# Patient Record
Sex: Female | Born: 1960 | Race: White | Hispanic: No | Smoking: Current every day smoker
Health system: Southern US, Community
[De-identification: ages and names within clinical notes are randomized; demographics above are authoritative.]

## PROBLEM LIST (undated history)

## (undated) DIAGNOSIS — Z87442 Personal history of urinary calculi: Secondary | ICD-10-CM

## (undated) DIAGNOSIS — R112 Nausea with vomiting, unspecified: Secondary | ICD-10-CM

## (undated) DIAGNOSIS — Z9889 Other specified postprocedural states: Secondary | ICD-10-CM

## (undated) DIAGNOSIS — F32A Depression, unspecified: Secondary | ICD-10-CM

## (undated) DIAGNOSIS — I10 Essential (primary) hypertension: Secondary | ICD-10-CM

## (undated) HISTORY — PX: BUNIONECTOMY: SHX129

## (undated) HISTORY — PX: EYE SURGERY: SHX253

## (undated) HISTORY — PX: TUBAL LIGATION: SHX77

## (undated) HISTORY — PX: LASER ABLATION OF THE CERVIX: SHX1949

## (undated) HISTORY — PX: CYST EXCISION: SHX5701

---

## 1986-01-13 DIAGNOSIS — N879 Dysplasia of cervix uteri, unspecified: Secondary | ICD-10-CM | POA: Insufficient documentation

## 2000-01-20 DIAGNOSIS — I10 Essential (primary) hypertension: Secondary | ICD-10-CM | POA: Insufficient documentation

## 2000-02-20 DIAGNOSIS — F32A Depression, unspecified: Secondary | ICD-10-CM | POA: Insufficient documentation

## 2009-02-19 DIAGNOSIS — E0500 Thyrotoxicosis with diffuse goiter without thyrotoxic crisis or storm: Secondary | ICD-10-CM | POA: Insufficient documentation

## 2012-08-10 IMAGING — NM NM RAI THERAPY FOR HYPERTHYROIDISM
1 series · 1 of 1 positions shown · non-contrast
Comparison: none

CLINICAL DATA: Graves disease

NUCLEAR MEDICINE RADIOACTIVE IODINE THERAPY FOR HYPERTHYROIDISM:
TECHNIQUE: I personally discussed the risks and benefits of D-3H3 therapy for
hyperthyroidism with the patient.
Alternate treatments were also discussed, and I answered the
patient's questions.
Radiation safety principals and post-therapy guidelines for
protecting the population were discussed with the patient.
Written informed consent for radioactive iodine therapy for
hyperthyroidism was obtained .
Time-out procedure performed.
Following ingestion of the radiopharmaceutical, patient was given a
page of standard post-therapy instructions.
Patient instructed to call the nuclear medicine department if any
questions arise.
No immediate complications.
Radiopharmaceutical:  25 mCi, D-3H3 sodium iodide capsule orally
Correlative exams:  Radionuclide nuclear medicine uptake and scan,
which demonstrated a 34.2% 24 radioiodine uptake and homogeneous
tracer distribution in both lobes compatible with Graves' disease.

[st static · 2.33mm/px · 1 of 1 slices shown]
[im 1/1]
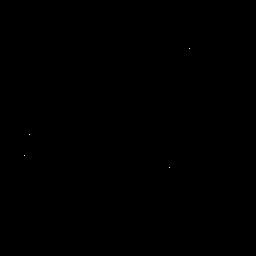

[1 of 1 positions shown; findings below may reference images not displayed]

IMPRESSION: Radioactive iodine therapy for Graves disease utilizing 25 mCi of MONTESINOS
131 sodium iodide orally.

## 2016-02-04 DIAGNOSIS — E0500 Thyrotoxicosis with diffuse goiter without thyrotoxic crisis or storm: Secondary | ICD-10-CM | POA: Diagnosis not present

## 2016-02-04 DIAGNOSIS — G4700 Insomnia, unspecified: Secondary | ICD-10-CM | POA: Diagnosis not present

## 2016-02-04 DIAGNOSIS — Z79899 Other long term (current) drug therapy: Secondary | ICD-10-CM | POA: Diagnosis not present

## 2016-02-12 DIAGNOSIS — I10 Essential (primary) hypertension: Secondary | ICD-10-CM | POA: Diagnosis not present

## 2016-02-12 DIAGNOSIS — D751 Secondary polycythemia: Secondary | ICD-10-CM | POA: Diagnosis not present

## 2016-02-12 DIAGNOSIS — E785 Hyperlipidemia, unspecified: Secondary | ICD-10-CM | POA: Diagnosis not present

## 2016-03-31 DIAGNOSIS — Z124 Encounter for screening for malignant neoplasm of cervix: Secondary | ICD-10-CM | POA: Diagnosis not present

## 2016-03-31 DIAGNOSIS — Z01419 Encounter for gynecological examination (general) (routine) without abnormal findings: Secondary | ICD-10-CM | POA: Diagnosis not present

## 2016-04-29 DIAGNOSIS — I872 Venous insufficiency (chronic) (peripheral): Secondary | ICD-10-CM | POA: Diagnosis not present

## 2016-04-29 DIAGNOSIS — L309 Dermatitis, unspecified: Secondary | ICD-10-CM | POA: Diagnosis not present

## 2016-08-04 DIAGNOSIS — D751 Secondary polycythemia: Secondary | ICD-10-CM | POA: Diagnosis not present

## 2016-08-12 DIAGNOSIS — I10 Essential (primary) hypertension: Secondary | ICD-10-CM | POA: Diagnosis not present

## 2016-08-12 DIAGNOSIS — Z23 Encounter for immunization: Secondary | ICD-10-CM | POA: Diagnosis not present

## 2016-08-12 DIAGNOSIS — D751 Secondary polycythemia: Secondary | ICD-10-CM | POA: Diagnosis not present

## 2016-08-25 DIAGNOSIS — E0789 Other specified disorders of thyroid: Secondary | ICD-10-CM | POA: Diagnosis not present

## 2017-03-09 DIAGNOSIS — G4700 Insomnia, unspecified: Secondary | ICD-10-CM | POA: Diagnosis not present

## 2017-03-09 DIAGNOSIS — I10 Essential (primary) hypertension: Secondary | ICD-10-CM | POA: Diagnosis not present

## 2017-03-09 DIAGNOSIS — Z79899 Other long term (current) drug therapy: Secondary | ICD-10-CM | POA: Diagnosis not present

## 2017-03-16 DIAGNOSIS — I10 Essential (primary) hypertension: Secondary | ICD-10-CM | POA: Diagnosis not present

## 2017-03-16 DIAGNOSIS — D751 Secondary polycythemia: Secondary | ICD-10-CM | POA: Diagnosis not present

## 2017-03-17 DIAGNOSIS — E039 Hypothyroidism, unspecified: Secondary | ICD-10-CM | POA: Diagnosis not present

## 2017-03-25 DIAGNOSIS — E0500 Thyrotoxicosis with diffuse goiter without thyrotoxic crisis or storm: Secondary | ICD-10-CM | POA: Diagnosis not present

## 2017-03-25 DIAGNOSIS — Z8639 Personal history of other endocrine, nutritional and metabolic disease: Secondary | ICD-10-CM | POA: Diagnosis not present

## 2017-03-25 DIAGNOSIS — E039 Hypothyroidism, unspecified: Secondary | ICD-10-CM | POA: Diagnosis not present

## 2017-04-07 DIAGNOSIS — R5383 Other fatigue: Secondary | ICD-10-CM | POA: Diagnosis not present

## 2017-04-07 DIAGNOSIS — Z1211 Encounter for screening for malignant neoplasm of colon: Secondary | ICD-10-CM | POA: Diagnosis not present

## 2017-04-07 DIAGNOSIS — Z1231 Encounter for screening mammogram for malignant neoplasm of breast: Secondary | ICD-10-CM | POA: Diagnosis not present

## 2017-04-07 DIAGNOSIS — Z01419 Encounter for gynecological examination (general) (routine) without abnormal findings: Secondary | ICD-10-CM | POA: Diagnosis not present

## 2017-06-29 DIAGNOSIS — D751 Secondary polycythemia: Secondary | ICD-10-CM | POA: Diagnosis not present

## 2017-07-06 DIAGNOSIS — I10 Essential (primary) hypertension: Secondary | ICD-10-CM | POA: Diagnosis not present

## 2017-07-06 DIAGNOSIS — D751 Secondary polycythemia: Secondary | ICD-10-CM | POA: Diagnosis not present

## 2017-08-03 DIAGNOSIS — I10 Essential (primary) hypertension: Secondary | ICD-10-CM | POA: Diagnosis not present

## 2017-08-06 DIAGNOSIS — H0264 Xanthelasma of left upper eyelid: Secondary | ICD-10-CM | POA: Diagnosis not present

## 2017-08-06 DIAGNOSIS — H0589 Other disorders of orbit: Secondary | ICD-10-CM | POA: Diagnosis not present

## 2017-08-06 DIAGNOSIS — H0261 Xanthelasma of right upper eyelid: Secondary | ICD-10-CM | POA: Diagnosis not present

## 2017-10-19 DIAGNOSIS — D649 Anemia, unspecified: Secondary | ICD-10-CM | POA: Diagnosis not present

## 2017-10-28 DIAGNOSIS — D751 Secondary polycythemia: Secondary | ICD-10-CM | POA: Diagnosis not present

## 2017-10-28 DIAGNOSIS — I10 Essential (primary) hypertension: Secondary | ICD-10-CM | POA: Diagnosis not present

## 2018-03-01 DIAGNOSIS — D751 Secondary polycythemia: Secondary | ICD-10-CM | POA: Diagnosis not present

## 2018-03-01 DIAGNOSIS — Z6830 Body mass index (BMI) 30.0-30.9, adult: Secondary | ICD-10-CM | POA: Diagnosis not present

## 2018-03-01 DIAGNOSIS — I10 Essential (primary) hypertension: Secondary | ICD-10-CM | POA: Diagnosis not present

## 2018-03-22 DIAGNOSIS — E039 Hypothyroidism, unspecified: Secondary | ICD-10-CM | POA: Diagnosis not present

## 2018-03-29 DIAGNOSIS — E039 Hypothyroidism, unspecified: Secondary | ICD-10-CM | POA: Diagnosis not present

## 2018-03-29 DIAGNOSIS — E0500 Thyrotoxicosis with diffuse goiter without thyrotoxic crisis or storm: Secondary | ICD-10-CM | POA: Diagnosis not present

## 2018-03-29 DIAGNOSIS — Z8639 Personal history of other endocrine, nutritional and metabolic disease: Secondary | ICD-10-CM | POA: Diagnosis not present

## 2019-06-06 ENCOUNTER — Other Ambulatory Visit (HOSPITAL_COMMUNITY): Payer: Self-pay | Admitting: Internal Medicine

## 2019-06-06 ENCOUNTER — Ambulatory Visit (HOSPITAL_COMMUNITY)
Admission: RE | Admit: 2019-06-06 | Discharge: 2019-06-06 | Disposition: A | Discharge status: Home / Self Care | Payer: 59 | Source: Ambulatory Visit | Attending: Internal Medicine | Admitting: Internal Medicine

## 2019-06-06 ENCOUNTER — Other Ambulatory Visit: Payer: Self-pay

## 2019-06-06 DIAGNOSIS — W19XXXA Unspecified fall, initial encounter: Secondary | ICD-10-CM

## 2019-06-06 DIAGNOSIS — W182XXA Fall in (into) shower or empty bathtub, initial encounter: Secondary | ICD-10-CM | POA: Diagnosis not present

## 2019-06-06 DIAGNOSIS — S022XXA Fracture of nasal bones, initial encounter for closed fracture: Secondary | ICD-10-CM | POA: Diagnosis present

## 2019-06-09 DIAGNOSIS — S01501A Unspecified open wound of lip, initial encounter: Secondary | ICD-10-CM | POA: Insufficient documentation

## 2019-06-09 DIAGNOSIS — K1379 Other lesions of oral mucosa: Secondary | ICD-10-CM | POA: Insufficient documentation

## 2019-06-09 DIAGNOSIS — S022XXA Fracture of nasal bones, initial encounter for closed fracture: Secondary | ICD-10-CM | POA: Insufficient documentation

## 2021-01-28 DIAGNOSIS — R739 Hyperglycemia, unspecified: Secondary | ICD-10-CM | POA: Diagnosis not present

## 2021-01-28 DIAGNOSIS — I10 Essential (primary) hypertension: Secondary | ICD-10-CM | POA: Diagnosis not present

## 2021-01-28 DIAGNOSIS — E0500 Thyrotoxicosis with diffuse goiter without thyrotoxic crisis or storm: Secondary | ICD-10-CM | POA: Diagnosis not present

## 2021-01-28 DIAGNOSIS — R69 Illness, unspecified: Secondary | ICD-10-CM | POA: Diagnosis not present

## 2021-01-28 DIAGNOSIS — G4700 Insomnia, unspecified: Secondary | ICD-10-CM | POA: Diagnosis not present

## 2021-01-28 DIAGNOSIS — D751 Secondary polycythemia: Secondary | ICD-10-CM | POA: Diagnosis not present

## 2021-01-28 DIAGNOSIS — Z79899 Other long term (current) drug therapy: Secondary | ICD-10-CM | POA: Diagnosis not present

## 2021-02-04 ENCOUNTER — Other Ambulatory Visit: Payer: Self-pay | Admitting: Internal Medicine

## 2021-02-04 ENCOUNTER — Other Ambulatory Visit (HOSPITAL_COMMUNITY): Payer: Self-pay | Admitting: Internal Medicine

## 2021-02-04 DIAGNOSIS — E039 Hypothyroidism, unspecified: Secondary | ICD-10-CM | POA: Diagnosis not present

## 2021-02-04 DIAGNOSIS — E1140 Type 2 diabetes mellitus with diabetic neuropathy, unspecified: Secondary | ICD-10-CM | POA: Diagnosis not present

## 2021-02-04 DIAGNOSIS — R1011 Right upper quadrant pain: Secondary | ICD-10-CM

## 2021-02-08 ENCOUNTER — Other Ambulatory Visit: Payer: Self-pay

## 2021-02-08 ENCOUNTER — Ambulatory Visit (HOSPITAL_COMMUNITY)
Admission: RE | Admit: 2021-02-08 | Discharge: 2021-02-08 | Disposition: A | Discharge status: Home / Self Care | Payer: 59 | Source: Ambulatory Visit | Attending: Internal Medicine | Admitting: Internal Medicine

## 2021-02-08 DIAGNOSIS — R1011 Right upper quadrant pain: Secondary | ICD-10-CM | POA: Insufficient documentation

## 2021-02-08 DIAGNOSIS — K8020 Calculus of gallbladder without cholecystitis without obstruction: Secondary | ICD-10-CM | POA: Diagnosis not present

## 2021-02-08 DIAGNOSIS — K760 Fatty (change of) liver, not elsewhere classified: Secondary | ICD-10-CM | POA: Diagnosis not present

## 2021-02-19 ENCOUNTER — Encounter: Payer: Self-pay | Admitting: Surgery

## 2021-02-19 ENCOUNTER — Ambulatory Visit: Payer: 59 | Admitting: Surgery

## 2021-02-19 ENCOUNTER — Other Ambulatory Visit: Payer: Self-pay

## 2021-02-19 VITALS — BP 120/80 | HR 63 | Temp 97.0°F | Resp 12 | Ht 60.0 in | Wt 160.0 lb

## 2021-02-19 DIAGNOSIS — K8020 Calculus of gallbladder without cholecystitis without obstruction: Secondary | ICD-10-CM

## 2021-02-19 NOTE — Progress Notes (Signed)
[facility] History and Physical  Reason for Referral: Cholelithiasis Referring Physician: Asencion Noble, MD  Chief Complaint   New Patient (Initial Visit)     Carol Stewart is a 61 y.o. female.  HPI: Patient presents for evaluation of cholelithiasis.  For the last 1-1/2 months, she has been having right upper quadrant tenderness.  She has a pressure in the area that is always present, but she has worsening sharp pains when she eats fatty foods.  She has modified her diet to a very bland diet including things like grits and oatmeal.  She does confirm nausea with emesis during her attacks.  Ibuprofen does help with her pain.  She was also given a prescription for Zofran, which does help her nausea.  She has been moving her bowels without issue, though she has had some loose bowel movements occasionally.  She has a past medical history significant for hypertension, Graves' disease, and neuropathy.  She denies any history of abdominal surgeries.  She denies use of blood thinning medications.  She smokes three quarters of a pack of cigarettes per day and she has done so for many years.  She very occasionally drinks alcohol, and denies illicit drug use.  No past medical history on file.  No family history on file.  Social History   Tobacco Use   Smoking status: Every Day    Types: Cigarettes   Smokeless tobacco: Never  Substance Use Topics   Alcohol use: Yes    Comment: ocassional    Medications: I have reviewed the patient's current medications. Allergies as of 02/19/2021       Reactions   Bee Venom Shortness Of Breath, Swelling, Other (See Comments)   Amoxicillin-pot Clavulanate Diarrhea   Codeine Itching   Morphine And Related Itching, Nausea And Vomiting   Oxycodone-acetaminophen Itching   Promethazine Other (See Comments)   Hallucination         Medication List        Accurate as of February 19, 2021  2:15 PM. If you have any questions, ask your nurse or  doctor.          albuterol 108 (90 Base) MCG/ACT inhaler Commonly known as: VENTOLIN HFA ProAir HFA 90 mcg/actuation aerosol inhaler   ALPRAZolam 0.25 MG tablet Commonly known as: XANAX Take 0.25 mg by mouth at bedtime as needed.   amLODipine 5 MG tablet Commonly known as: NORVASC Take 5 mg by mouth daily.   DULoxetine 60 MG capsule Commonly known as: CYMBALTA 60 mg daily.   levothyroxine 75 MCG tablet Commonly known as: SYNTHROID Take 75 mcg by mouth every morning.   metoprolol succinate 50 MG 24 hr tablet Commonly known as: TOPROL-XL metoprolol succinate ER 50 mg tablet,extended release 24 hr   ondansetron 4 mg Tabs tablet Commonly known as: ZOFRAN 1 tablet as needed.   traZODone 50 MG tablet Commonly known as: DESYREL 1 tablet at bedtime as needed         ROS:  Constitutional: negative for chills, fatigue, and fevers Eyes: negative for visual disturbance and pain Ears, nose, mouth, throat, and face: negative for ear drainage, sore throat, and sinus problems Respiratory: negative for cough, wheezing, and shortness of breath Cardiovascular: negative for chest pain and palpitations Gastrointestinal: positive for abdominal pain, nausea, and vomiting, negative for reflux symptoms Genitourinary:negative for dysuria, frequency, and urinary retention Integument/breast: positive for dryness, negative for rash Hematologic/lymphatic: negative for bleeding and lymphadenopathy Musculoskeletal:negative for back pain, neck pain, and joint pain Neurological:  negative for dizziness, tremors, and numbness Endocrine: negative for temperature intolerance  Blood pressure 120/80, pulse 63, temperature (!) 97 F (36.1 C), temperature source Other (Comment), resp. rate 12, height 5' (1.524 m), weight 160 lb (72.6 kg), SpO2 94 %. Physical Exam Vitals reviewed.  Constitutional:      Appearance: Normal appearance.  HENT:     Head: Normocephalic and atraumatic.  Eyes:      Extraocular Movements: Extraocular movements intact.     Pupils: Pupils are equal, round, and reactive to light.  Cardiovascular:     Rate and Rhythm: Normal rate and regular rhythm.  Pulmonary:     Effort: Pulmonary effort is normal.     Breath sounds: Normal breath sounds.  Abdominal:     Comments: Abdomen soft, non-distended, no percussion tenderness, mild right upper quadrant tenderness to palpation, no rigidity, guarding, or rebound tenderness; (-) Murphy's sign  Musculoskeletal:        General: Normal range of motion.     Cervical back: Normal range of motion.  Skin:    General: Skin is warm and dry.  Neurological:     General: No focal deficit present.     Mental Status: She is alert and oriented to person, place, and time.  Psychiatric:        Mood and Affect: Mood normal.        Behavior: Behavior normal.    Results: Abdominal Ultrasound (02/09/21): IMPRESSION: 1. Multiple gallstones without sonographic findings of acute cholecystitis. 2. Mild hepatic steatosis.  Assessment & Plan:  Carol Stewart is a 61 y.o. female who presents for evaluation of her cholelithiasis and RUQ pain.  -It was discussed that the patient's symptoms are consistent with symptomatic cholelithiasis -I counseled the patient about the indication, risks and benefits of laparoscopic cholecystectomy.  She understands there is a very small chance for bleeding, infection, injury to normal structures (including common bile duct), conversion to open surgery, persistent symptoms, evolution of postcholecystectomy diarrhea, need for secondary interventions, anesthesia reaction, cardiopulmonary issues and other risks not specifically detailed here. I described the expected recovery, the plan for follow-up and the restrictions during the recovery phase.  All questions were answered. -Patient tentatively scheduled for 03/06/21 -Recommend continuing bland diet until surgery  All questions were answered to the  satisfaction of the patient.  Graciella Freer, DO [facility] [address] [place], [place] [address] [phone] (office)

## 2021-02-20 NOTE — H&P (Signed)
[facility] History and Physical ° °Reason for Referral: Cholelithiasis °Referring Physician: Roy Fagan, MD ° °Chief Complaint   °New Patient (Initial Visit) °  ° ° °Carol Stewart is a 61 y.o. female.  °HPI: Patient presents for evaluation of cholelithiasis.  For the last 1-1/2 months, she has been having right upper quadrant tenderness.  She has a pressure in the area that is always present, but she has worsening sharp pains when she eats fatty foods.  She has modified her diet to a very bland diet including things like grits and oatmeal.  She does confirm nausea with emesis during her attacks.  Ibuprofen does help with her pain.  She was also given a prescription for Zofran, which does help her nausea.  She has been moving her bowels without issue, though she has had some loose bowel movements occasionally.  She has a past medical history significant for hypertension, Graves' disease, and neuropathy.  She denies any history of abdominal surgeries.  She denies use of blood thinning medications.  She smokes three quarters of a pack of cigarettes per day and she has done so for many years.  She very occasionally drinks alcohol, and denies illicit drug use. ° °No past medical history on file. ° °No family history on file. ° °Social History  ° °Tobacco Use  ° Smoking status: Every Day  °  Types: Cigarettes  ° Smokeless tobacco: Never  °Substance Use Topics  ° Alcohol use: Yes  °  Comment: ocassional  ° ° °Medications: I have reviewed the patient's current medications. °Allergies as of 02/19/2021   ° °   Reactions  ° Bee Venom Shortness Of Breath, Swelling, Other (See Comments)  ° Amoxicillin-pot Clavulanate Diarrhea  ° Codeine Itching  ° Morphine And Related Itching, Nausea And Vomiting  ° Oxycodone-acetaminophen Itching  ° Promethazine Other (See Comments)  ° Hallucination   ° °  ° °  °Medication List  °  ° °  ° Accurate as of February 19, 2021  2:15 PM. If you have any questions, ask your nurse or  doctor.  °  °  ° °  ° °albuterol 108 (90 Base) MCG/ACT inhaler °Commonly known as: VENTOLIN HFA °ProAir HFA 90 mcg/actuation aerosol inhaler °  °ALPRAZolam 0.25 MG tablet °Commonly known as: XANAX °Take 0.25 mg by mouth at bedtime as needed. °  °amLODipine 5 MG tablet °Commonly known as: NORVASC °Take 5 mg by mouth daily. °  °DULoxetine 60 MG capsule °Commonly known as: CYMBALTA °60 mg daily. °  °levothyroxine 75 MCG tablet °Commonly known as: SYNTHROID °Take 75 mcg by mouth every morning. °  °metoprolol succinate 50 MG 24 hr tablet °Commonly known as: TOPROL-XL °metoprolol succinate ER 50 mg tablet,extended release 24 hr °  °ondansetron 4 mg Tabs tablet °Commonly known as: ZOFRAN °1 tablet as needed. °  °traZODone 50 MG tablet °Commonly known as: DESYREL °1 tablet at bedtime as needed °  ° °  ° ° ° °ROS:  °Constitutional: negative for chills, fatigue, and fevers °Eyes: negative for visual disturbance and pain °Ears, nose, mouth, throat, and face: negative for ear drainage, sore throat, and sinus problems °Respiratory: negative for cough, wheezing, and shortness of breath °Cardiovascular: negative for chest pain and palpitations °Gastrointestinal: positive for abdominal pain, nausea, and vomiting, negative for reflux symptoms °Genitourinary:negative for dysuria, frequency, and urinary retention °Integument/breast: positive for dryness, negative for rash °Hematologic/lymphatic: negative for bleeding and lymphadenopathy °Musculoskeletal:negative for back pain, neck pain, and joint pain °Neurological:   and shortness of breath Cardiovascular: negative for chest pain and palpitations Gastrointestinal: positive for abdominal pain, nausea, and vomiting, negative for reflux symptoms Genitourinary:negative for dysuria, frequency, and urinary retention Integument/breast: positive for dryness, negative for rash Hematologic/lymphatic: negative for bleeding and lymphadenopathy Musculoskeletal:negative for back pain, neck pain, and joint pain Neurological: negative for dizziness, tremors, and numbness Endocrine: negative for temperature intolerance   Blood pressure 120/80, pulse 63, temperature (!) 97 F (36.1 C), temperature source Other (Comment), resp. rate 12, height 5' (1.524 m), weight 160 lb (72.6 kg), SpO2 94 %. Physical Exam Vitals reviewed.  Constitutional:      Appearance: Normal appearance.  HENT:      Head: Normocephalic and atraumatic.  Eyes:     Extraocular Movements: Extraocular movements intact.     Pupils: Pupils are equal, round, and reactive to light.  Cardiovascular:     Rate and Rhythm: Normal rate and regular rhythm.  Pulmonary:     Effort: Pulmonary effort is normal.     Breath sounds: Normal breath sounds.  Abdominal:     Comments: Abdomen soft, non-distended, no percussion tenderness, mild right upper quadrant tenderness to palpation, no rigidity, guarding, or rebound tenderness; (-) Murphy's sign  Musculoskeletal:        General: Normal range of motion.     Cervical back: Normal range of motion.  Skin:    General: Skin is warm and dry.  Neurological:     General: No focal deficit present.     Mental Status: She is alert and oriented to person, place, and time.  Psychiatric:        Mood and Affect: Mood normal.        Behavior: Behavior normal.      Results: Abdominal Ultrasound (02/09/21): IMPRESSION: 1. Multiple gallstones without sonographic findings of acute cholecystitis. 2. Mild hepatic steatosis.   Assessment & Plan:  Carol Stewart is a 61 y.o. female who presents for evaluation of her cholelithiasis and RUQ pain.   -It was discussed that the patient's symptoms are consistent with symptomatic cholelithiasis -I counseled the patient about the indication, risks and benefits of laparoscopic cholecystectomy.  She understands there is a very small chance for bleeding, infection, injury to normal structures (including common bile duct), conversion to open surgery, persistent symptoms, evolution of postcholecystectomy diarrhea, need for secondary interventions, anesthesia reaction, cardiopulmonary issues and other risks not specifically detailed here. I described the expected recovery, the plan for follow-up and the restrictions during the recovery phase.  All questions were answered. -Patient tentatively scheduled for 03/06/21 -Recommend continuing bland diet until  surgery   All questions were answered to the satisfaction of the patient.   Graciella Freer, DO [facility] [address] [place], [place] [address] [phone] (office)

## 2021-03-01 NOTE — Patient Instructions (Signed)
Carol Stewart  03/01/2021     @PREFPERIOPPHARMACY @   Your procedure is scheduled on  03/06/2021.   Report to [facility] at  1025 A.M.   Call this number if you have problems the morning of surgery:  [phone]   Remember:  Do not eat or drink after midnight.      Take these medicines the morning of surgery with A SIP OF WATER                       levothyroxine, zofran(if needed).     Do not wear jewelry, make-up or nail polish.  Do not wear lotions, powders, or perfumes, or deodorant.  Do not shave 48 hours prior to surgery.  Men may shave face and neck.  Do not bring valuables to the hospital.  [facility] is not responsible for any belongings or valuables.  Contacts, dentures or bridgework may not be worn into surgery.  Leave your suitcase in the car.  After surgery it may be brought to your room.  For patients admitted to the hospital, discharge time will be determined by your treatment team.  Patients discharged the day of surgery will not be allowed to drive home and must have someone with them for 24 hours.    Special instructions:   DO NOT smoke tobacco or vape for 24 hors before your procedure.  Please read over the following fact sheets that you were given. Coughing and Deep Breathing, Surgical Site Infection Prevention, Anesthesia Post-op Instructions, and Care and Recovery After Surgery      Minimally Invasive Cholecystectomy, Care After The following information offers guidance on how to care for yourself after your procedure. Your health care provider may also give you more specific instructions. If you have problems or questions, contact your health care provider. What can I expect after the procedure? After the procedure, it is common to have: Pain at your incision sites. You will be given medicines to control this pain. Mild nausea or vomiting. Bloating and possible shoulder pain from the gas that was used during the procedure. Follow  these instructions at home: Medicines Take over-the-counter and prescription medicines only as told by your health care provider. If you were prescribed an antibiotic medicine, take it as told by your health care provider. Do not stop using the antibiotic even if you start to feel better. Ask your health care provider if the medicine prescribed to you: Requires you to avoid driving or using machinery. Can cause constipation. You may need to take these actions to prevent or treat constipation: Drink enough fluid to keep your urine pale yellow. Take over-the-counter or prescription medicines. Eat foods that are high in fiber, such as beans, whole grains, and fresh fruits and vegetables. Limit foods that are high in fat and processed sugars, such as fried or sweet foods. Incision care  Follow instructions from your health care provider about how to take care of your incisions. Make sure you: Wash your hands with soap and water for at least 20 seconds before and after you change your bandage (dressing). If soap and water are not available, use hand sanitizer. Change your dressing as told by your health care provider. Leave stitches (sutures), skin glue, or adhesive strips in place. These skin closures may need to be in place for 2 weeks or longer. If adhesive strip edges start to loosen and curl up, you may trim the loose edges.  Do not remove adhesive strips completely unless your health care provider tells you to do that. Do not take baths, swim, or use a hot tub until your health care provider approves. Ask your health care provider if you may take showers. You may only be allowed to take sponge baths. Check your incision area every day for signs of infection. Check for: More redness, swelling, or pain. Fluid or blood. Warmth. Pus or a bad smell. Activity Rest as told by your health care provider. Do not do activities that require a lot of effort. Avoid sitting for a long time without moving.  Get up to take short walks every 1-2 hours. This is important to improve blood flow and breathing. Ask for help if you feel weak or unsteady. Do not lift anything that is heavier than 10 lb (4.5 kg), or the limit that you are told, until your health care provider says that it is safe. Do not play contact sports until your health care provider approves. Do not return to work or school until your health care provider approves. Return to your normal activities as told by your health care provider. Ask your health care provider what activities are safe for you. General instructions If you were given a sedative during the procedure, it can affect you for several hours. Do not drive or operate machinery until your health care provider says that it is safe. Keep all follow-up visits. This is important. Contact a health care provider if: You develop a rash. You have more redness, swelling, or pain around your incisions. You have fluid or blood coming from your incisions. Your incisions feel warm to the touch. You have pus or a bad smell coming from your incisions. You have a fever. One or more of your incisions breaks open. Get help right away if: You have trouble breathing. You have chest pain. You have more pain in your shoulders. You faint or feel dizzy when you stand. You have severe pain in your abdomen. You have nausea or vomiting that lasts for more than one day. You have leg pain that is new or unusual, or if it is localized to one specific spot. These symptoms may represent a serious problem that is an emergency. Do not wait to see if the symptoms will go away. Get medical help right away. Call your local emergency services (911 in the [place] Do not drive yourself to the hospital. Summary After your procedure, it is common to have pain at the incision sites. You may also have nausea or bloating. Follow your health care provider's instructions about medicine, activity restrictions, and  caring for your incision areas. Do not do activities that require a lot of effort. Contact a health care provider if you have a fever or other signs of infection, such as more redness, swelling, or pain around the incisions. Get help right away if you have chest pain, increasing pain in the shoulders, or trouble breathing. This information is not intended to replace advice given to you by your health care provider. Make sure you discuss any questions you have with your health care provider. Document Revised: 07/03/2020 Document Reviewed: 07/03/2020 Elsevier Patient Education  2022 [employer] General Anesthesia, Adult, Care After This sheet gives you information about how to care for yourself after your procedure. Your health care provider may also give you more specific instructions. If you have problems or questions, contact your health care provider. What can I expect after the procedure? After the procedure, the  following side effects are common: Pain or discomfort at the IV site. Nausea. Vomiting. Sore throat. Trouble concentrating. Feeling cold or chills. Feeling weak or tired. Sleepiness and fatigue. Soreness and body aches. These side effects can affect parts of the body that were not involved in surgery. Follow these instructions at home: For the time period you were told by your health care provider:  Rest. Do not participate in activities where you could fall or become injured. Do not drive or use machinery. Do not drink alcohol. Do not take sleeping pills or medicines that cause drowsiness. Do not make important decisions or sign legal documents. Do not take care of children on your own. Eating and drinking Follow any instructions from your health care provider about eating or drinking restrictions. When you feel hungry, start by eating small amounts of foods that are soft and easy to digest (bland), such as toast. Gradually return to your regular diet. Drink enough  fluid to keep your urine pale yellow. If you vomit, rehydrate by drinking water, juice, or clear broth. General instructions If you have sleep apnea, surgery and certain medicines can increase your risk for breathing problems. Follow instructions from your health care provider about wearing your sleep device: Anytime you are sleeping, including during daytime naps. While taking prescription pain medicines, sleeping medicines, or medicines that make you drowsy. Have a responsible adult stay with you for the time you are told. It is important to have someone help care for you until you are awake and alert. Return to your normal activities as told by your health care provider. Ask your health care provider what activities are safe for you. Take over-the-counter and prescription medicines only as told by your health care provider. If you smoke, do not smoke without supervision. Keep all follow-up visits as told by your health care provider. This is important. Contact a health care provider if: You have nausea or vomiting that does not get better with medicine. You cannot eat or drink without vomiting. You have pain that does not get better with medicine. You are unable to pass urine. You develop a skin rash. You have a fever. You have redness around your IV site that gets worse. Get help right away if: You have difficulty breathing. You have chest pain. You have blood in your urine or stool, or you vomit blood. Summary After the procedure, it is common to have a sore throat or nausea. It is also common to feel tired. Have a responsible adult stay with you for the time you are told. It is important to have someone help care for you until you are awake and alert. When you feel hungry, start by eating small amounts of foods that are soft and easy to digest (bland), such as toast. Gradually return to your regular diet. Drink enough fluid to keep your urine pale yellow. Return to your normal  activities as told by your health care provider. Ask your health care provider what activities are safe for you. This information is not intended to replace advice given to you by your health care provider. Make sure you discuss any questions you have with your health care provider. Document Revised: 09/15/2019 Document Reviewed: 04/14/2019 Elsevier Patient Education  2022 Elsevier Inc. How to Use Chlorhexidine for Bathing Chlorhexidine gluconate (CHG) is a germ-killing (antiseptic) solution that is used to clean the skin. It can get rid of the bacteria that normally live on the skin and can keep them away for about 24  hours. To clean your skin with CHG, you may be given: A CHG solution to use in the shower or as part of a sponge bath. A prepackaged cloth that contains CHG. Cleaning your skin with CHG may help lower the risk for infection: While you are staying in the intensive care unit of the hospital. If you have a vascular access, such as a central line, to provide short-term or long-term access to your veins. If you have a catheter to drain urine from your bladder. If you are on a ventilator. A ventilator is a machine that helps you breathe by moving air in and out of your lungs. After surgery. What are the risks? Risks of using CHG include: A skin reaction. Hearing loss, if CHG gets in your ears and you have a perforated eardrum. Eye injury, if CHG gets in your eyes and is not rinsed out. The CHG product catching fire. Make sure that you avoid smoking and flames after applying CHG to your skin. Do not use CHG: If you have a chlorhexidine allergy or have previously reacted to chlorhexidine. On babies younger than 712 months of age. How to use CHG solution Use CHG only as told by your health care provider, and follow the instructions on the label. Use the full amount of CHG as directed. Usually, this is one bottle. During a shower Follow these steps when using CHG solution during a  shower (unless your health care provider gives you different instructions): Start the shower. Use your normal soap and shampoo to wash your face and hair. Turn off the shower or move out of the shower stream. Pour the CHG onto a clean washcloth. Do not use any type of brush or rough-edged sponge. Starting at your neck, lather your body down to your toes. Make sure you follow these instructions: If you will be having surgery, pay special attention to the part of your body where you will be having surgery. Scrub this area for at least 1 minute. Do not use CHG on your head or face. If the solution gets into your ears or eyes, rinse them well with water. Avoid your genital area. Avoid any areas of skin that have broken skin, cuts, or scrapes. Scrub your back and under your arms. Make sure to wash skin folds. Let the lather sit on your skin for 1-2 minutes or as long as told by your health care provider. Thoroughly rinse your entire body in the shower. Make sure that all body creases and crevices are rinsed well. Dry off with a clean towel. Do not put any substances on your body afterward--such as powder, lotion, or perfume--unless you are told to do so by your health care provider. Only use lotions that are recommended by the manufacturer. Put on clean clothes or pajamas. If it is the night before your surgery, sleep in clean sheets.  During a sponge bath Follow these steps when using CHG solution during a sponge bath (unless your health care provider gives you different instructions): Use your normal soap and shampoo to wash your face and hair. Pour the CHG onto a clean washcloth. Starting at your neck, lather your body down to your toes. Make sure you follow these instructions: If you will be having surgery, pay special attention to the part of your body where you will be having surgery. Scrub this area for at least 1 minute. Do not use CHG on your head or face. If the solution gets into your  ears or eyes,  rinse them well with water. Avoid your genital area. Avoid any areas of skin that have broken skin, cuts, or scrapes. Scrub your back and under your arms. Make sure to wash skin folds. Let the lather sit on your skin for 1-2 minutes or as long as told by your health care provider. Using a different clean, wet washcloth, thoroughly rinse your entire body. Make sure that all body creases and crevices are rinsed well. Dry off with a clean towel. Do not put any substances on your body afterward--such as powder, lotion, or perfume--unless you are told to do so by your health care provider. Only use lotions that are recommended by the manufacturer. Put on clean clothes or pajamas. If it is the night before your surgery, sleep in clean sheets. How to use CHG prepackaged cloths Only use CHG cloths as told by your health care provider, and follow the instructions on the label. Use the CHG cloth on clean, dry skin. Do not use the CHG cloth on your head or face unless your health care provider tells you to. When washing with the CHG cloth: Avoid your genital area. Avoid any areas of skin that have broken skin, cuts, or scrapes. Before surgery Follow these steps when using a CHG cloth to clean before surgery (unless your health care provider gives you different instructions): Using the CHG cloth, vigorously scrub the part of your body where you will be having surgery. Scrub using a back-and-forth motion for 3 minutes. The area on your body should be completely wet with CHG when you are done scrubbing. Do not rinse. Discard the cloth and let the area air-dry. Do not put any substances on the area afterward, such as powder, lotion, or perfume. Put on clean clothes or pajamas. If it is the night before your surgery, sleep in clean sheets.  For general bathing Follow these steps when using CHG cloths for general bathing (unless your health care provider gives you different instructions). Use a  separate CHG cloth for each area of your body. Make sure you wash between any folds of skin and between your fingers and toes. Wash your body in the following order, switching to a new cloth after each step: The front of your neck, shoulders, and chest. Both of your arms, under your arms, and your hands. Your stomach and groin area, avoiding the genitals. Your right leg and foot. Your left leg and foot. The back of your neck, your back, and your buttocks. Do not rinse. Discard the cloth and let the area air-dry. Do not put any substances on your body afterward--such as powder, lotion, or perfume--unless you are told to do so by your health care provider. Only use lotions that are recommended by the manufacturer. Put on clean clothes or pajamas. Contact a health care provider if: Your skin gets irritated after scrubbing. You have questions about using your solution or cloth. You swallow any chlorhexidine. Call your local poison control center (620 559 5456 in the [place] Get help right away if: Your eyes itch badly, or they become very red or swollen. Your skin itches badly and is red or swollen. Your hearing changes. You have trouble seeing. You have swelling or tingling in your mouth or throat. You have trouble breathing. These symptoms may represent a serious problem that is an emergency. Do not wait to see if the symptoms will go away. Get medical help right away. Call your local emergency services (911 in the [place] Do not drive yourself to the  hospital. Summary Chlorhexidine gluconate (CHG) is a germ-killing (antiseptic) solution that is used to clean the skin. Cleaning your skin with CHG may help to lower your risk for infection. You may be given CHG to use for bathing. It may be in a bottle or in a prepackaged cloth to use on your skin. Carefully follow your health care provider's instructions and the instructions on the product label. Do not use CHG if you have a chlorhexidine  allergy. Contact your health care provider if your skin gets irritated after scrubbing. This information is not intended to replace advice given to you by your health care provider. Make sure you discuss any questions you have with your health care provider. Document Revised: 03/12/2020 Document Reviewed: 03/12/2020 Elsevier Patient Education  2022 [employer].

## 2021-03-04 ENCOUNTER — Encounter (HOSPITAL_COMMUNITY)
Admission: RE | Admit: 2021-03-04 | Discharge: 2021-03-04 | Disposition: A | Discharge status: Home / Self Care | Payer: 59 | Source: Ambulatory Visit | Attending: Surgery | Admitting: Surgery

## 2021-03-04 ENCOUNTER — Encounter (HOSPITAL_COMMUNITY): Payer: Self-pay

## 2021-03-04 DIAGNOSIS — Z01810 Encounter for preprocedural cardiovascular examination: Secondary | ICD-10-CM | POA: Insufficient documentation

## 2021-03-04 HISTORY — DX: Depression, unspecified: F32.A

## 2021-03-04 HISTORY — DX: Personal history of urinary calculi: Z87.442

## 2021-03-04 HISTORY — DX: Essential (primary) hypertension: I10

## 2021-03-04 HISTORY — DX: Nausea with vomiting, unspecified: R11.2

## 2021-03-04 HISTORY — DX: Nausea with vomiting, unspecified: Z98.890

## 2021-03-06 ENCOUNTER — Ambulatory Visit (HOSPITAL_COMMUNITY)
Admission: RE | Admit: 2021-03-06 | Discharge: 2021-03-06 | Disposition: A | Discharge status: Home / Self Care | Payer: 59 | Attending: Surgery | Admitting: Surgery

## 2021-03-06 ENCOUNTER — Other Ambulatory Visit: Payer: Self-pay

## 2021-03-06 ENCOUNTER — Encounter (HOSPITAL_COMMUNITY)
Admission: RE | Disposition: A | Discharge status: Home / Self Care | Payer: Self-pay | Source: Home / Self Care | Attending: Surgery

## 2021-03-06 ENCOUNTER — Ambulatory Visit (HOSPITAL_COMMUNITY): Payer: 59 | Admitting: Anesthesiology

## 2021-03-06 ENCOUNTER — Ambulatory Visit (HOSPITAL_BASED_OUTPATIENT_CLINIC_OR_DEPARTMENT_OTHER): Payer: 59 | Admitting: Anesthesiology

## 2021-03-06 ENCOUNTER — Encounter (HOSPITAL_COMMUNITY): Payer: Self-pay | Admitting: Surgery

## 2021-03-06 DIAGNOSIS — F17210 Nicotine dependence, cigarettes, uncomplicated: Secondary | ICD-10-CM | POA: Insufficient documentation

## 2021-03-06 DIAGNOSIS — K760 Fatty (change of) liver, not elsewhere classified: Secondary | ICD-10-CM | POA: Diagnosis not present

## 2021-03-06 DIAGNOSIS — I10 Essential (primary) hypertension: Secondary | ICD-10-CM | POA: Diagnosis not present

## 2021-03-06 DIAGNOSIS — E0500 Thyrotoxicosis with diffuse goiter without thyrotoxic crisis or storm: Secondary | ICD-10-CM | POA: Insufficient documentation

## 2021-03-06 DIAGNOSIS — F32A Depression, unspecified: Secondary | ICD-10-CM | POA: Diagnosis not present

## 2021-03-06 DIAGNOSIS — K8020 Calculus of gallbladder without cholecystitis without obstruction: Secondary | ICD-10-CM

## 2021-03-06 DIAGNOSIS — G629 Polyneuropathy, unspecified: Secondary | ICD-10-CM | POA: Insufficient documentation

## 2021-03-06 DIAGNOSIS — K828 Other specified diseases of gallbladder: Secondary | ICD-10-CM | POA: Diagnosis not present

## 2021-03-06 DIAGNOSIS — R69 Illness, unspecified: Secondary | ICD-10-CM | POA: Diagnosis not present

## 2021-03-06 DIAGNOSIS — K8010 Calculus of gallbladder with chronic cholecystitis without obstruction: Secondary | ICD-10-CM | POA: Diagnosis not present

## 2021-03-06 HISTORY — PX: CHOLECYSTECTOMY: SHX55

## 2021-03-06 SURGERY — LAPAROSCOPIC CHOLECYSTECTOMY
Anesthesia: General | Site: Abdomen

## 2021-03-06 MED ORDER — IPRATROPIUM-ALBUTEROL 0.5-2.5 (3) MG/3ML IN SOLN
RESPIRATORY_TRACT | Status: AC
  Filled 2021-03-06: qty 3

## 2021-03-06 MED ORDER — ONDANSETRON HCL 4 MG/2ML IJ SOLN
INTRAMUSCULAR | Status: AC
  Filled 2021-03-06: qty 2

## 2021-03-06 MED ORDER — CHLORHEXIDINE GLUCONATE 0.12 % MT SOLN
15.0000 mL | Freq: Once | OROMUCOSAL | Status: AC
  Administered 2021-03-06: 15 mL via OROMUCOSAL

## 2021-03-06 MED ORDER — CHLORHEXIDINE GLUCONATE CLOTH 2 % EX PADS
6.0000 | MEDICATED_PAD | Freq: Once | CUTANEOUS | Status: AC
  Administered 2021-03-06: 6 via TOPICAL

## 2021-03-06 MED ORDER — DOCUSATE SODIUM 100 MG PO CAPS: 100.0000 mg | ORAL_CAPSULE | Freq: Two times a day (BID) | ORAL | 0 refills | Status: AC

## 2021-03-06 MED ORDER — FENTANYL CITRATE (PF) 100 MCG/2ML IJ SOLN
INTRAMUSCULAR | Status: DC | PRN
  Administered 2021-03-06 (×2): 100 ug via INTRAVENOUS

## 2021-03-06 MED ORDER — SODIUM CHLORIDE 0.9 % IV SOLN
INTRAVENOUS | Status: AC
  Filled 2021-03-06: qty 2

## 2021-03-06 MED ORDER — PROPOFOL 10 MG/ML IV BOLUS
INTRAVENOUS | Status: DC | PRN
  Administered 2021-03-06 (×2): 150 mg via INTRAVENOUS

## 2021-03-06 MED ORDER — LACTATED RINGERS IV SOLN: INTRAVENOUS | Status: DC

## 2021-03-06 MED ORDER — SCOPOLAMINE 1 MG/3DAYS TD PT72
1.0000 | MEDICATED_PATCH | Freq: Once | TRANSDERMAL | Status: DC
  Administered 2021-03-06: 1.5 mg via TRANSDERMAL
  Filled 2021-03-06: qty 1

## 2021-03-06 MED ORDER — ACETAMINOPHEN 500 MG PO TABS: 500.0000 mg | ORAL_TABLET | Freq: Four times a day (QID) | ORAL | 0 refills | Status: AC

## 2021-03-06 MED ORDER — ONDANSETRON HCL 4 MG/2ML IJ SOLN
4.0000 mg | Freq: Once | INTRAMUSCULAR | Status: AC | PRN
  Administered 2021-03-06: 4 mg via INTRAVENOUS
  Filled 2021-03-06: qty 2

## 2021-03-06 MED ORDER — LIDOCAINE HCL (PF) 2 % IJ SOLN
INTRAMUSCULAR | Status: AC
  Filled 2021-03-06: qty 10

## 2021-03-06 MED ORDER — HYDROMORPHONE HCL 1 MG/ML IJ SOLN
0.5000 mg | INTRAMUSCULAR | Status: DC | PRN
  Administered 2021-03-06: 0.5 mg via INTRAVENOUS
  Filled 2021-03-06: qty 0.5

## 2021-03-06 MED ORDER — TRAMADOL HCL 50 MG PO TABS
50.0000 mg | ORAL_TABLET | Freq: Four times a day (QID) | ORAL | 0 refills | Status: DC | PRN
Start: 2021-03-06 — End: 2022-08-13

## 2021-03-06 MED ORDER — DEXAMETHASONE SODIUM PHOSPHATE 10 MG/ML IJ SOLN
INTRAMUSCULAR | Status: AC
  Filled 2021-03-06: qty 1

## 2021-03-06 MED ORDER — EPHEDRINE SULFATE (PRESSORS) 50 MG/ML IJ SOLN
INTRAMUSCULAR | Status: DC | PRN
Start: 2021-03-06 — End: 2021-03-06
  Administered 2021-03-06 (×2): 10 mg via INTRAVENOUS

## 2021-03-06 MED ORDER — BUPIVACAINE HCL (PF) 0.5 % IJ SOLN
INTRAMUSCULAR | Status: AC
  Filled 2021-03-06: qty 30

## 2021-03-06 MED ORDER — SUGAMMADEX SODIUM 500 MG/5ML IV SOLN
INTRAVENOUS | Status: DC | PRN
Start: 2021-03-06 — End: 2021-03-06
  Administered 2021-03-06: 200 mg via INTRAVENOUS

## 2021-03-06 MED ORDER — ORAL CARE MOUTH RINSE: 15.0000 mL | Freq: Once | OROMUCOSAL | Status: AC

## 2021-03-06 MED ORDER — IPRATROPIUM-ALBUTEROL 0.5-2.5 (3) MG/3ML IN SOLN
3.0000 mL | RESPIRATORY_TRACT | Status: DC
Start: 2021-03-06 — End: 2021-03-06
  Administered 2021-03-06: 3 mL via RESPIRATORY_TRACT

## 2021-03-06 MED ORDER — EPHEDRINE 5 MG/ML INJ
INTRAVENOUS | Status: AC
  Filled 2021-03-06: qty 5

## 2021-03-06 MED ORDER — IPRATROPIUM-ALBUTEROL 0.5-2.5 (3) MG/3ML IN SOLN: 3.0000 mL | Freq: Once | RESPIRATORY_TRACT | Status: AC

## 2021-03-06 MED ORDER — MIDAZOLAM HCL 2 MG/2ML IJ SOLN
INTRAMUSCULAR | Status: DC | PRN
Start: 2021-03-06 — End: 2021-03-06
  Administered 2021-03-06: 2 mg via INTRAVENOUS

## 2021-03-06 MED ORDER — ROCURONIUM BROMIDE 10 MG/ML (PF) SYRINGE
PREFILLED_SYRINGE | INTRAVENOUS | Status: DC | PRN
  Administered 2021-03-06 (×2): 50 mg via INTRAVENOUS

## 2021-03-06 MED ORDER — SODIUM CHLORIDE 0.9 % IR SOLN
Status: DC | PRN
  Administered 2021-03-06: 1000 mL

## 2021-03-06 MED ORDER — PROPOFOL 10 MG/ML IV BOLUS
INTRAVENOUS | Status: AC
  Filled 2021-03-06: qty 20

## 2021-03-06 MED ORDER — ONDANSETRON HCL 4 MG/2ML IJ SOLN
INTRAMUSCULAR | Status: DC | PRN
  Administered 2021-03-06: 4 mg via INTRAVENOUS

## 2021-03-06 MED ORDER — LIDOCAINE HCL (CARDIAC) PF 100 MG/5ML IV SOSY
PREFILLED_SYRINGE | INTRAVENOUS | Status: DC | PRN
  Administered 2021-03-06 (×2): 40 mg via INTRATRACHEAL

## 2021-03-06 MED ORDER — SODIUM CHLORIDE 0.9 % IV SOLN
2.0000 g | INTRAVENOUS | Status: AC
  Administered 2021-03-06: 2 g via INTRAVENOUS

## 2021-03-06 MED ORDER — PHENYLEPHRINE HCL (PRESSORS) 10 MG/ML IV SOLN
INTRAVENOUS | Status: DC | PRN
Start: 2021-03-06 — End: 2021-03-06
  Administered 2021-03-06: 120 ug via INTRAVENOUS
  Administered 2021-03-06: 80 ug via INTRAVENOUS
  Administered 2021-03-06: 120 ug via INTRAVENOUS

## 2021-03-06 MED ORDER — MIDAZOLAM HCL 2 MG/2ML IJ SOLN
INTRAMUSCULAR | Status: AC
  Filled 2021-03-06: qty 2

## 2021-03-06 MED ORDER — PHENYLEPHRINE 40 MCG/ML (10ML) SYRINGE FOR IV PUSH (FOR BLOOD PRESSURE SUPPORT)
PREFILLED_SYRINGE | INTRAVENOUS | Status: AC
  Filled 2021-03-06: qty 10

## 2021-03-06 MED ORDER — BUPIVACAINE HCL (PF) 0.5 % IJ SOLN
INTRAMUSCULAR | Status: DC | PRN
  Administered 2021-03-06: 15 mL

## 2021-03-06 MED ORDER — HEMOSTATIC AGENTS (NO CHARGE) OPTIME
TOPICAL | Status: DC | PRN
  Administered 2021-03-06: 1

## 2021-03-06 MED ORDER — IBUPROFEN 200 MG PO TABS: 200.0000 mg | ORAL_TABLET | Freq: Four times a day (QID) | ORAL | 0 refills | Status: AC

## 2021-03-06 MED ORDER — DEXAMETHASONE SODIUM PHOSPHATE 10 MG/ML IJ SOLN
INTRAMUSCULAR | Status: DC | PRN
Start: 2021-03-06 — End: 2021-03-06
  Administered 2021-03-06: 10 mg via INTRAVENOUS

## 2021-03-06 MED ORDER — FENTANYL CITRATE (PF) 250 MCG/5ML IJ SOLN
INTRAMUSCULAR | Status: AC
  Filled 2021-03-06: qty 5

## 2021-03-06 MED ORDER — MEPERIDINE HCL 50 MG/ML IJ SOLN: 6.2500 mg | INTRAMUSCULAR | Status: DC | PRN

## 2021-03-06 SURGICAL SUPPLY — 43 items
ADH SKN CLS APL DERMABOND .7 (GAUZE/BANDAGES/DRESSINGS) ×1
APL PRP STRL LF DISP 70% ISPRP (MISCELLANEOUS) ×1
APL PRP STRL LF ISPRP CHG 10.5 (MISCELLANEOUS) ×1
APPLICATOR CHLORAPREP 10.5 ORG (MISCELLANEOUS) ×1 IMPLANT
APPLIER CLIP ROT 10 11.4 M/L (STAPLE) ×2
APR CLP MED LRG 11.4X10 (STAPLE) ×1
BAG RETRIEVAL 10 (BASKET) ×1
BLADE SURG 15 STRL LF DISP TIS (BLADE) ×1 IMPLANT
BLADE SURG 15 STRL SS (BLADE) ×2
CHLORAPREP W/TINT 26 (MISCELLANEOUS) ×2 IMPLANT
CLIP APPLIE ROT 10 11.4 M/L (STAPLE) ×1 IMPLANT
CLOTH BEACON ORANGE TIMEOUT ST (SAFETY) ×2 IMPLANT
COVER LIGHT HANDLE STERIS (MISCELLANEOUS) ×4 IMPLANT
DERMABOND ADVANCED (GAUZE/BANDAGES/DRESSINGS) ×1
DERMABOND ADVANCED .7 DNX12 (GAUZE/BANDAGES/DRESSINGS) ×1 IMPLANT
ELECT REM PT RETURN 9FT ADLT (ELECTROSURGICAL) ×2
ELECTRODE REM PT RTRN 9FT ADLT (ELECTROSURGICAL) ×1 IMPLANT
GAUZE 4X4 16PLY ~~LOC~~+RFID DBL (SPONGE) ×2 IMPLANT
GLOVE SURG ENC MOIS LTX SZ6.5 (GLOVE) ×2 IMPLANT
GLOVE SURG POLYISO LF SZ7 (GLOVE) ×2 IMPLANT
GLOVE SURG UNDER POLY LF SZ6.5 (GLOVE) ×1 IMPLANT
GLOVE SURG UNDER POLY LF SZ7 (GLOVE) ×8 IMPLANT
GOWN STRL REUS W/TWL LRG LVL3 (GOWN DISPOSABLE) ×6 IMPLANT
HEMOSTAT SNOW SURGICEL 2X4 (HEMOSTASIS) ×2 IMPLANT
INST SET LAPROSCOPIC AP (KITS) ×2 IMPLANT
KIT TURNOVER KIT A (KITS) ×2 IMPLANT
MANIFOLD NEPTUNE II (INSTRUMENTS) ×2 IMPLANT
NDL INSUFFLATION 14GA 120MM (NEEDLE) ×1 IMPLANT
NEEDLE INSUFFLATION 14GA 120MM (NEEDLE) ×2 IMPLANT
NS IRRIG 1000ML POUR BTL (IV SOLUTION) ×2 IMPLANT
PACK LAP CHOLE LZT030E (CUSTOM PROCEDURE TRAY) ×2 IMPLANT
PAD ARMBOARD 7.5X6 YLW CONV (MISCELLANEOUS) ×2 IMPLANT
SET BASIN LINEN APH (SET/KITS/TRAYS/PACK) ×2 IMPLANT
SET TUBE SMOKE EVAC HIGH FLOW (TUBING) ×2 IMPLANT
SUT MNCRL AB 4-0 PS2 18 (SUTURE) ×4 IMPLANT
SUT VICRYL 0 UR6 27IN ABS (SUTURE) ×2 IMPLANT
SYS BAG RETRIEVAL 10MM (BASKET) ×1
SYSTEM BAG RETRIEVAL 10MM (BASKET) ×1 IMPLANT
TROCAR Z-THRD FIOS HNDL 11X100 (TROCAR) ×2 IMPLANT
TROCAR Z-THREAD FIOS 5X100MM (TROCAR) ×2 IMPLANT
TUBE CONNECTING 12X1/4 (SUCTIONS) ×2 IMPLANT
WARMER LAPAROSCOPE (MISCELLANEOUS) ×2 IMPLANT
YANKAUER SUCT BULB TIP 10FT TU (MISCELLANEOUS) ×1 IMPLANT

## 2021-03-06 NOTE — Anesthesia Postprocedure Evaluation (Signed)
Anesthesia Post Note  Patient: Carol Stewart  Procedure(s) Performed: LAPAROSCOPIC CHOLECYSTECTOMY (Abdomen)  Patient location during evaluation: Phase II Anesthesia Type: General Level of consciousness: awake and alert and oriented Pain management: pain level controlled Vital Signs Assessment: post-procedure vital signs reviewed and stable Respiratory status: spontaneous breathing, nonlabored ventilation and respiratory function stable Cardiovascular status: blood pressure returned to baseline and stable Postop Assessment: no apparent nausea or vomiting Anesthetic complications: no Comments: Oxygen saturations - 93 on room air   No notable events documented.   Last Vitals:  Vitals:   03/06/21 1459 03/06/21 1502  BP: 101/66 104/74  Pulse: 68 71  Resp: 14 15  Temp:  36.7 C  SpO2: 91% 96%    Last Pain:  Vitals:   03/06/21 1502  TempSrc: Oral  PainSc: 3                  Bow Buntyn C Zamari Bonsall

## 2021-03-06 NOTE — Discharge Instructions (Signed)
Ambulatory Surgery Discharge Instructions  General Anesthesia or Sedation Do not drive or operate heavy machinery for 24 hours.  Do not consume alcohol, tranquilizers, sleeping medications, or any non-prescribed medications for 24 hours. Do not make important decisions or sign any important papers in the next 24 hours. You should have someone with you tonight at home.  Activity  You are advised to go directly home from the hospital.  Restrict your activities and rest for a day.  Resume light activity tomorrow. No heavy lifting over 10 lbs or strenuous exercise.  Fluids and Diet Begin with clear liquids, bouillon, dry toast, soda crackers.  If not nauseated, you may go to a regular diet when you desire.  Greasy and spicy foods are not advised.  Medications  If you have not had a bowel movement in 24 hours, take 2 tablespoons over the counter Milk of mag.             You May resume your blood thinners tomorrow (Aspirin, coumadin, or other).  You are being discharged with prescriptions for Opioid/Narcotic Medications: There are some specific considerations for these medications that you should know. Opioid Meds have risks & benefits. Addiction to these meds is always a concern with prolonged use Take medication only as directed Do not drive while taking narcotic pain medication Do not crush tablets or capsules Do not use a different container than medication was dispensed in Lock the container of medication in a cool, dry place out of reach of children and pets. Opioid medication can cause addiction Do not share with anyone else (this is a felony) Do not store medications for future use. Dispose of them properly.     Disposal:  Find a [facility] household drug take back site near you.  If you can't get to a drug take back site, use the recipe below as a last resort to dispose of expired, unused or unwanted drugs. Disposal  (Do not dispose chemotherapy drugs this way, talk to your  prescribing doctor instead.) Step 1: Mix drugs (do not crush) with dirt, kitty litter, or used coffee grounds and add a small amount of water to dissolve any solid medications. Step 2: Seal drugs in plastic bag. Step 3: Place plastic bag in trash. Step 4: Take prescription container and scratch out personal information, then recycle or throw away.  Operative Site  You have a liquid bandage over your incisions, this will begin to flake off in about a week. Ok to shower tomorrow. Keep wound clean and dry. No baths or swimming. No lifting more than 10 pounds.  Contact Information: If you have questions or concerns, please call our office, [phone], Monday- Thursday 8AM-5PM and Friday 8AM-12Noon.  If it is after hours or on the weekend, please call [facility] Main Number, [phone], and ask to speak to the surgeon on call for Dr. Kortland Nichols at [place].   SPECIFIC COMPLICATIONS TO WATCH FOR: Inability to urinate Fever over 101? F by mouth Nausea and vomiting lasting longer than 24 hours. Pain not relieved by medication ordered Swelling around the operative site Increased redness, warmth, hardness, around operative area Numbness, tingling, or cold fingers or toes Blood -soaked dressing, (small amounts of oozing may be normal) Increasing and progressive drainage from surgical area or exam site  

## 2021-03-06 NOTE — Progress Notes (Signed)
Update Note:  Called patient's friend, Aretta Nip, to update her after the surgery.  It was explained that we were able to get Carol Stewart's gallbladder out without issue.  She will recover in PACU and be able to go home once she is awake enough.  She has been discharged with prescriptions for pain medications PRN.  She will have a phone follow up with me in 2 weeks.  All questions were answered to her expressed satisfaction.  Theophilus Kinds, DO [facility] [address] [place], [place] [address] [phone] (office)

## 2021-03-06 NOTE — Anesthesia Procedure Notes (Signed)
Procedure Name: Intubation Date/Time: 03/06/2021 12:35 PM Performed by: Karna Dupes, CRNA Pre-anesthesia Checklist: Patient identified, Emergency Drugs available, Suction available and Patient being monitored Patient Re-evaluated:Patient Re-evaluated prior to induction Oxygen Delivery Method: Circle system utilized Preoxygenation: Pre-oxygenation with 100% oxygen Induction Type: IV induction Ventilation: Mask ventilation without difficulty Laryngoscope Size: Mac and 3 Grade View: Grade II Tube type: Oral Tube size: 7.0 mm Number of attempts: 1 Airway Equipment and Method: Stylet Placement Confirmation: ETT inserted through vocal cords under direct vision, positive ETCO2 and breath sounds checked- equal and bilateral Secured at: 21 cm Tube secured with: Tape Dental Injury: Teeth and Oropharynx as per pre-operative assessment

## 2021-03-06 NOTE — Interval H&P Note (Signed)
History and Physical Interval Note:  03/06/2021 11:36 AM  Carol Stewart  has presented today for surgery, with the diagnosis of CHOLELITHIASIS.  The various methods of treatment have been discussed with the patient and family. After consideration of risks, benefits and other options for treatment, the patient has consented to  Procedure(s): LAPAROSCOPIC CHOLECYSTECTOMY (N/A) as a surgical intervention.  The patient's history has been reviewed, patient examined, no change in status, stable for surgery.  I have reviewed the patient's chart and labs.  Questions were answered to the patient's satisfaction.     [place]

## 2021-03-06 NOTE — Transfer of Care (Signed)
Immediate Anesthesia Transfer of Care Note  Patient: Carol Stewart  Procedure(s) Performed: LAPAROSCOPIC CHOLECYSTECTOMY (Abdomen)  Patient Location: PACU  Anesthesia Type:General  Level of Consciousness: awake  Airway & Oxygen Therapy: Patient Spontanous Breathing and Patient connected to nasal cannula oxygen  Post-op Assessment: Report given to RN and Post -op Vital signs reviewed and stable  Post vital signs: Reviewed and stable  Last Vitals:  Vitals Value Taken Time  BP 117/74 03/06/21 1345  Temp    Pulse 71 03/06/21 1345  Resp 15 03/06/21 1345  SpO2 97 % 03/06/21 1345  Vitals shown include unvalidated device data.  Last Pain:  Vitals:   03/06/21 1059  TempSrc: Oral  PainSc: 4       Patients Stated Pain Goal: 8 (03/06/21 1059)  Complications: No notable events documented.

## 2021-03-06 NOTE — Op Note (Signed)
Operative Note   Preoperative Diagnosis: Symptomatic cholelithiasis   Postoperative Diagnosis: Same   Procedure(s) Performed: Laparoscopic cholecystectomy   Surgeon: Theophilus Kinds, DO    Assistants: Cecile Sheerer, RN   Anesthesia: General endotracheal   Anesthesiologist: Molli Barrows, MD    Specimens: Gallbladder    Estimated Blood Loss: Minimal    Blood Replacement: None    Complications: None    Operative Findings: Minimally inflamed gallbladder  Indications: Patient is a 61 year old female who presents for laparoscopic cholecystectomy.  She has been having RUQ abdominal pain after eating, and she was noted to have gallstones on abdominal US.  She is agreeable to laparoscopic cholecystectomy.  All risks, benefits, and alternatives to laparoscopic cholecystectomy were discussed with the patient, all of her questions were answered to her expressed satisfaction. The patient expresses she wishes to proceed, and informed consent was obtained.    Procedure: The patient was taken to the operating room and placed supine. General endotracheal anesthesia was induced. Intravenous antibiotics were administered per protocol. An orogastric tube positioned to decompress the stomach. The abdomen was prepared and draped in the usual sterile fashion.    A supraumbilical incision was made and a Veress technique was utilized to achieve pneumoperitoneum to 15 mmHg with carbon dioxide. A 11 mm optiview port was placed through the supraumbilical region, and a 10 mm 0-degree operative laparoscope was introduced. The area underlying the trocar and Veress needle were inspected and without evidence of injury.  Remaining trocars were placed under direct vision. Two 5 mm ports were placed in the right abdomen, between the anterior axillary and midclavicular line.  A final 11 mm port was placed through the mid-epigastrium, near the falciform ligament.    The gallbladder fundus was elevated  cephalad and the infundibulum was retracted to the patient's right. The gallbladder/cystic duct junction was skeletonized. The cystic artery noted in the triangle of Calot and was also skeletonized.  We then continued liberal medial and lateral dissection until the critical view of safety was achieved.    The cystic duct and cystic artery were doubly clipped and divided. The gallbladder was then dissected from the liver bed with electrocautery. The specimen was placed in an Endopouch and was retrieved through the epigastric site.   Final inspection revealed acceptable hemostasis. Surgical SNOW was placed in the gallbladder bed.  Trocars were removed and pneumoperitoneum was released.  0 Vicryl fascial sutures were used to close the epigastric and umbilical port sites. Skin incisions were closed with 4-0 Monocryl subcuticular sutures and Dermabond. The patient was awakened from anesthesia and extubated without complication.    Theophilus Kinds, DO  [facility] [address] [place], [place] [address] [phone] (office)

## 2021-03-06 NOTE — Anesthesia Preprocedure Evaluation (Addendum)
Anesthesia Evaluation  Patient identified by MRN, date of birth, ID band Patient awake    Reviewed: Allergy & Precautions, NPO status , Patient's Chart, lab work & pertinent test results, reviewed documented beta blocker date and time   History of Anesthesia Complications (+) PONV and history of anesthetic complications  Airway Mallampati: II  TM Distance: >3 FB Neck ROM: Full    Dental  (+) Dental Advisory Given, Teeth Intact   Pulmonary Current SmokerPatient did not abstain from smoking.,     + wheezing      Cardiovascular hypertension, Pt. on medications and Pt. on home beta blockers Normal cardiovascular exam Rhythm:Regular Rate:Normal     Neuro/Psych PSYCHIATRIC DISORDERS Depression negative neurological ROS     GI/Hepatic negative GI ROS, (+)     substance abuse  alcohol use,   Endo/Other  negative endocrine ROS  Renal/GU negative Renal ROS  negative genitourinary   Musculoskeletal negative musculoskeletal ROS (+)   Abdominal   Peds negative pediatric ROS (+)  Hematology negative hematology ROS (+)   Anesthesia Other Findings   Reproductive/Obstetrics negative OB ROS                         Anesthesia Physical Anesthesia Plan  ASA: 2  Anesthesia Plan: General   Post-op Pain Management: Minimal or no pain anticipated   Induction: Intravenous  PONV Risk Score and Plan: 4 or greater and Ondansetron, Dexamethasone, Midazolam and Scopolamine patch - Pre-op  Airway Management Planned: Oral ETT  Additional Equipment:   Intra-op Plan:   Post-operative Plan: Extubation in OR  Informed Consent: I have reviewed the patients History and Physical, chart, labs and discussed the procedure including the risks, benefits and alternatives for the proposed anesthesia with the patient or authorized representative who has indicated his/her understanding and acceptance.     Dental  advisory given  Plan Discussed with: CRNA and Surgeon  Anesthesia Plan Comments:         Anesthesia Quick Evaluation

## 2021-03-08 LAB — SURGICAL PATHOLOGY

## 2021-03-14 ENCOUNTER — Telehealth: Payer: Self-pay | Admitting: *Deleted

## 2021-03-14 ENCOUNTER — Encounter (HOSPITAL_COMMUNITY): Payer: Self-pay | Admitting: Surgery

## 2021-03-14 MED ORDER — NYSTATIN 100000 UNIT/ML MT SUSP: 5.0000 mL | Freq: Four times a day (QID) | OROMUCOSAL | 0 refills | Status: DC | PRN

## 2021-03-14 NOTE — Telephone Encounter (Signed)
Received call from patient [phone]~ telephone.  ? ?Surgical Date: 03/06/2021 ?Procedure: Lap Chole ? ?Concerns: Patient reports that she has noted mouth irritation since surgery. States that she initially thought irritation and tenderness was due to intubation/ anesthesia. Reports that she has noted thick white film coating her tongue, and tenderness to mouth and gums.  ? ?Reports that she was treated with ABTx prior to surgery and feels that she has thrush.  ? ?Requested prescription for diflucan and/ or nystatin mouthwash. Ok to order? ? ?[facility]: [facility]  ? ?

## 2021-03-14 NOTE — Telephone Encounter (Signed)
Provider in office and agreeable to Nystatin mouthwash.  ? ?Prescription sent to pharmacy.  ? ?Call placed to patient and patient made aware.  ?

## 2021-03-22 ENCOUNTER — Other Ambulatory Visit: Payer: Self-pay

## 2021-03-22 ENCOUNTER — Ambulatory Visit (INDEPENDENT_AMBULATORY_CARE_PROVIDER_SITE_OTHER): Payer: 59 | Admitting: Surgery

## 2021-03-22 DIAGNOSIS — Z09 Encounter for follow-up examination after completed treatment for conditions other than malignant neoplasm: Secondary | ICD-10-CM

## 2021-03-22 NOTE — Progress Notes (Signed)
[facility] ? ?I am calling the patient for post operative evaluation. This is not a billable encounter as it is under the global charges for the surgery.  The patient had a laparoscopic cholecystectomy on 2/22. The patient reports that she is doing well. She is tolerating a diet without nausea and vomiting, having good pain control, and having regular Bms.  The incisions are healing well.  She has a scab at her lateral incision, and she it putting Neosporin on this incision site.  She was using ice packs for her pain post-operatively. The patient has no concerns. All questions answered to her expressed satisfaction. ? ?Pathology: ?A. GALLBLADDER, CHOLECYSTECTOMY:  ?Chronic cholecystitis with cholelithiasis  ?One benign pericholecystic lymph node  ? ?Will see the patient PRN.  ? ?Theophilus Kinds, DO ?[facility] ?[address] ?[place], [place] [address] ?[phone] (office) ? ?

## 2021-05-14 DIAGNOSIS — E119 Type 2 diabetes mellitus without complications: Secondary | ICD-10-CM | POA: Diagnosis not present

## 2021-05-23 DIAGNOSIS — E032 Hypothyroidism due to medicaments and other exogenous substances: Secondary | ICD-10-CM | POA: Diagnosis not present

## 2021-05-27 DIAGNOSIS — Z01419 Encounter for gynecological examination (general) (routine) without abnormal findings: Secondary | ICD-10-CM | POA: Diagnosis not present

## 2021-05-27 DIAGNOSIS — Z1231 Encounter for screening mammogram for malignant neoplasm of breast: Secondary | ICD-10-CM | POA: Diagnosis not present

## 2021-06-26 DIAGNOSIS — E119 Type 2 diabetes mellitus without complications: Secondary | ICD-10-CM | POA: Diagnosis not present

## 2021-06-26 DIAGNOSIS — H16213 Exposure keratoconjunctivitis, bilateral: Secondary | ICD-10-CM | POA: Diagnosis not present

## 2021-08-16 DIAGNOSIS — M545 Low back pain, unspecified: Secondary | ICD-10-CM | POA: Insufficient documentation

## 2021-09-17 DIAGNOSIS — E039 Hypothyroidism, unspecified: Secondary | ICD-10-CM | POA: Diagnosis not present

## 2021-09-17 DIAGNOSIS — Z79899 Other long term (current) drug therapy: Secondary | ICD-10-CM | POA: Diagnosis not present

## 2021-09-17 DIAGNOSIS — E118 Type 2 diabetes mellitus with unspecified complications: Secondary | ICD-10-CM | POA: Diagnosis not present

## 2021-09-17 DIAGNOSIS — I10 Essential (primary) hypertension: Secondary | ICD-10-CM | POA: Diagnosis not present

## 2021-09-24 DIAGNOSIS — R3121 Asymptomatic microscopic hematuria: Secondary | ICD-10-CM | POA: Diagnosis not present

## 2021-10-03 DIAGNOSIS — E039 Hypothyroidism, unspecified: Secondary | ICD-10-CM | POA: Diagnosis not present

## 2021-10-03 DIAGNOSIS — E1165 Type 2 diabetes mellitus with hyperglycemia: Secondary | ICD-10-CM | POA: Diagnosis not present

## 2021-10-03 DIAGNOSIS — E785 Hyperlipidemia, unspecified: Secondary | ICD-10-CM | POA: Diagnosis not present

## 2021-10-07 ENCOUNTER — Ambulatory Visit (INDEPENDENT_AMBULATORY_CARE_PROVIDER_SITE_OTHER): Payer: 59 | Admitting: Physician Assistant

## 2021-10-07 ENCOUNTER — Encounter: Payer: Self-pay | Admitting: Physician Assistant

## 2021-10-07 VITALS — BP 128/78 | HR 68

## 2021-10-07 DIAGNOSIS — R3121 Asymptomatic microscopic hematuria: Secondary | ICD-10-CM

## 2021-10-07 DIAGNOSIS — N3946 Mixed incontinence: Secondary | ICD-10-CM | POA: Diagnosis not present

## 2021-10-07 DIAGNOSIS — R062 Wheezing: Secondary | ICD-10-CM | POA: Diagnosis not present

## 2021-10-07 DIAGNOSIS — Z720 Tobacco use: Secondary | ICD-10-CM | POA: Diagnosis not present

## 2021-10-07 DIAGNOSIS — R310 Gross hematuria: Secondary | ICD-10-CM | POA: Diagnosis not present

## 2021-10-07 LAB — URINALYSIS, ROUTINE W REFLEX MICROSCOPIC
Bilirubin, UA: NEGATIVE
Glucose, UA: NEGATIVE
Ketones, UA: NEGATIVE
Leukocytes,UA: NEGATIVE
Nitrite, UA: NEGATIVE
Protein,UA: NEGATIVE
Specific Gravity, UA: 1.025 (ref 1.005–1.030)
Urobilinogen, Ur: 0.2 mg/dL (ref 0.2–1.0)
pH, UA: 5.5 (ref 5.0–7.5)

## 2021-10-07 LAB — MICROSCOPIC EXAMINATION: Bacteria, UA: NONE SEEN

## 2021-10-07 NOTE — Progress Notes (Signed)
10/07/2021 10:24 AM   Carol Stewart 1960/05/13 [id number]   Assessment:  1. Asymptomatic microscopic hematuria - Urinalysis, Routine w reflex microscopic - CT HEMATURIA WORKUP  2. Mixed stress and urge urinary incontinence  3. Tobacco use  4. Wheezing   Plan: Today I had a discussion with the patient regarding the findings of microscopic hematuria including the implications and differential diagnoses associated with it.  I also discussed recommendations for further evaluation including the rationale for upper tract imaging and cystoscopy.  I discussed the nature of these procedures including potential risk and complications.  The patient expressed an understanding of these issues.  The patient also conveys that she is aware that her tobacco use puts her at risk for bladder dysplasia.  Recommended smoking cessation and patient made aware of wheezing noted during exam. No treatment recommended for mild mixed urinary incontinence symptoms as patient states she is doing quite well since changing her diet after her diabetes diagnosis.  Patient will follow-up for cystoscopic exam following CT hematuria study.    Chief Complaint: No chief complaint on file.   Referring provider: Asencion Noble, MD [address] [place],   [address]   History of Present Illness:  Carol Stewart is a 61 y.o. year old female with h/o HTN, tobacco abuse and MUI seen in consultation from Asencion Noble, MD  for evaluation of microscopic hematuria.  Patient reports that her OB/GYN, Dr. Stann Mainland, at [facility] in [place] told her a year ago that she had microscopic hematuria.  She had her annual visit this year with the same finding.  It was recommended that she follow-up with her primary care provider Dr. Willey Blade who also noted microscopic blood.  Patient denies any history of gross hematuria and reports no LUTS.  She has mild mixed urinary incontinence symptoms but does not wear pads.  She has been on  Ditropan in the past but discontinued due to side effects.  Recent diagnosis of type 2 diabetes and changes in her diet have significantly improved her urinary symptoms of urgency and frequency.  Patient denies history of UTIs and has questionable history of urinary stones in 2010.  The patient reports she had severe left-sided flank pain which was felt to be renal colic due to passage of a stone, but there was no imaging and she did not pass a stone fragment.  Patient reports cigarette smoking for "many years".  Rare alcohol use.  The patient is on no anticoagulant medications.  Number of pads/day= none Soda intake= 2 Cokes/day UA= 5-10 RBCs   Medical records including notes, lab results, and past imaging studies reviewed during pt OV.  Past Medical History:  Past Medical History:  Diagnosis Date   Depression    History of kidney stones    Hypertension    PONV (postoperative nausea and vomiting)     Past Surgical History:  Past Surgical History:  Procedure Laterality Date   BUNIONECTOMY Right    CHOLECYSTECTOMY N/A 03/06/2021   Procedure: LAPAROSCOPIC CHOLECYSTECTOMY;  Surgeon: Rusty Aus, DO;  Location: [facility];  Service: General;  Laterality: N/A;   CYST EXCISION     Back   EYE SURGERY     LASER ABLATION OF THE CERVIX     TUBAL LIGATION      Allergies:  Allergies  Allergen Reactions   Bee Venom Shortness Of Breath and Swelling   Amoxicillin-Pot Clavulanate Diarrhea   Codeine Itching   Morphine And Related Itching and Nausea And  Vomiting   Oxycodone-Acetaminophen Itching   Promethazine Other (See Comments)    Hallucination     Family History:  History reviewed. No pertinent family history.  Social History:  Social History   Tobacco Use   Smoking status: Every Day    Packs/day: 1.00    Years: 25.00    Total pack years: 25.00    Types: Cigarettes   Smokeless tobacco: Never  Substance Use Topics   Alcohol use: Yes    Comment: ocassional   Drug use:  Never    Review of symptoms:  Constitutional:  Negative for unexplained weight loss, night sweats, fever, chills ENT:  Negative for nose bleeds, sinus pain, painful swallowing CV:  Negative for chest pain, shortness of breath, exercise intolerance, palpitations, loss of consciousness Resp:  Negative for shortness of breath GI:  Negative for nausea, vomiting, diarrhea, bloody stools GU:  Positives noted in HPI; otherwise negative for gross hematuria Neuro:  Negative for seizures, poor balance, limb weakness, slurred speech Psych:  Negative for lack of energy, depression, anxiety Endocrine:  Negative for polydipsia, polyuria, symptoms of hypoglycemia (dizziness, hunger, sweating) Hematologic:  Negative for anemia, purpura, petechia, prolonged or excessive bleeding, use of anticoagulants   Physical Exam: BP 128/78   Pulse 68   Constitutional:  Alert and oriented, No acute distress. HEENT: NCAT, moist mucus membranes.  Trachea midline, no masses. Cardiovascular: Regular rate and rhythm without murmur, rub, or gallops No clubbing, cyanosis, or edema. Respiratory: Normal respiratory effort, expiratory wheezes noted right lower lobe GI: Abdomen is soft, nontender, nondistended, no abdominal masses BACK:  Non-tender to palpation.  No CVAT Lymph: No cervical or inguinal lymphadenopathy. Skin: No obvious rashes, warm, dry, intact Neurologic: Alert and oriented, Cranial nerves grossly intact, no focal deficits, moving all 4 extremities. Psychiatric: Appropriate. Normal mood and affect.  Laboratory Data: No results found for this or any previous visit (from the past 24 hour(s)).  No results found for: "WBC", "HGB", "HCT", "MCV", "PLT"  No results found for: "CREATININE"   Urinalysis No results found for: "COLORURINE", "APPEARANCEUR", "LABSPEC", "PHURINE", "GLUCOSEU", "HGBUR", "BILIRUBINUR", "KETONESUR", "PROTEINUR", "UROBILINOGEN", "NITRITE", "LEUKOCYTESUR"  No results found for:  "LABMICR", "WBCUA", "RBCUA", "LABEPIT", "MUCUS", "BACTERIA"  Pertinent Imaging: No results found for this or any previous visit.  No results found for this or any previous visit.     [place], Regan Rakers, PA-C [facility] [place]

## 2021-10-24 ENCOUNTER — Ambulatory Visit
Admission: RE | Admit: 2021-10-24 | Discharge: 2021-10-24 | Disposition: A | Discharge status: Home / Self Care | Payer: 59 | Source: Ambulatory Visit | Attending: Physician Assistant | Admitting: Physician Assistant

## 2021-10-24 DIAGNOSIS — R3129 Other microscopic hematuria: Secondary | ICD-10-CM | POA: Diagnosis not present

## 2021-10-24 DIAGNOSIS — D3500 Benign neoplasm of unspecified adrenal gland: Secondary | ICD-10-CM | POA: Diagnosis not present

## 2021-10-24 DIAGNOSIS — K5730 Diverticulosis of large intestine without perforation or abscess without bleeding: Secondary | ICD-10-CM | POA: Diagnosis not present

## 2021-10-24 DIAGNOSIS — I700 Atherosclerosis of aorta: Secondary | ICD-10-CM | POA: Diagnosis not present

## 2021-10-24 MED ORDER — IOPAMIDOL (ISOVUE-300) INJECTION 61%
100.0000 mL | Freq: Once | INTRAVENOUS | Status: AC | PRN
  Administered 2021-10-24: 100 mL via INTRAVENOUS

## 2021-10-29 ENCOUNTER — Telehealth: Payer: Self-pay

## 2021-10-29 NOTE — Telephone Encounter (Signed)
-----   Message from Reynaldo Minium, [place] sent at 10/29/2021  5:05 PM EDT ----- Please let the pt know her CT looks okay and Dr. Alyson Ingles will give her details at her cysto appt. ----- Message ----- From: Interface, Rad Results In Sent: 10/26/2021   7:20 AM EDT To: Reynaldo Minium, PA-C

## 2021-10-29 NOTE — Telephone Encounter (Signed)
Made patient aware that her CT looks okay and Dr. Alyson Ingles will give patient detail at her cysto appt. Patient voiced she do not know why she need a cysto if the CT looks okay. Made patient aware that the cysto gives information about the lining of her bladder. Patient voiced understanding.

## 2021-10-30 ENCOUNTER — Other Ambulatory Visit: Payer: 59 | Admitting: Urology

## 2021-11-14 DIAGNOSIS — E039 Hypothyroidism, unspecified: Secondary | ICD-10-CM | POA: Diagnosis not present

## 2021-11-15 ENCOUNTER — Ambulatory Visit (INDEPENDENT_AMBULATORY_CARE_PROVIDER_SITE_OTHER): Payer: 59 | Admitting: Urology

## 2021-11-15 VITALS — BP 109/74 | HR 69

## 2021-11-15 DIAGNOSIS — R3121 Asymptomatic microscopic hematuria: Secondary | ICD-10-CM | POA: Diagnosis not present

## 2021-11-15 LAB — MICROSCOPIC EXAMINATION

## 2021-11-15 LAB — URINALYSIS, ROUTINE W REFLEX MICROSCOPIC
Bilirubin, UA: NEGATIVE
Glucose, UA: NEGATIVE
Ketones, UA: NEGATIVE
Leukocytes,UA: NEGATIVE
Nitrite, UA: NEGATIVE
Protein,UA: NEGATIVE
Specific Gravity, UA: 1.015 (ref 1.005–1.030)
Urobilinogen, Ur: 0.2 mg/dL (ref 0.2–1.0)
pH, UA: 7 (ref 5.0–7.5)

## 2021-11-15 MED ORDER — CIPROFLOXACIN HCL 500 MG PO TABS
500.0000 mg | ORAL_TABLET | Freq: Once | ORAL | Status: AC
  Administered 2021-11-15: 500 mg via ORAL

## 2021-11-15 NOTE — Progress Notes (Signed)
   11/15/21  CC: microscopic hematuria   HPI: Ms Rens is a 61yo here for followup for microscopic hematuria. CT hematuria protocol showed no GU abnormalities Blood pressure 109/74, pulse 69. NED. A&Ox3.   No respiratory distress   Abd soft, NT, ND Normal external genitalia with patent urethral meatus  Cystoscopy Procedure Note  Patient identification was confirmed, informed consent was obtained, and patient was prepped using Betadine solution.  Lidocaine jelly was administered per urethral meatus.    Procedure: - Flexible cystoscope introduced, without any difficulty.   - Thorough search of the bladder revealed:    normal urethral meatus    normal urothelium    no stones    no ulcers     no tumors    no urethral polyps    no trabeculation  - Ureteral orifices were normal in position and appearance.  Post-Procedure: - Patient tolerated the procedure well  Assessment/ Plan: RTC 6 months with UA   No follow-ups on file.  Nicolette Bang, MD

## 2021-11-15 NOTE — Patient Instructions (Signed)

## 2021-11-18 LAB — CYTOLOGY, URINE

## 2021-12-23 DIAGNOSIS — E039 Hypothyroidism, unspecified: Secondary | ICD-10-CM | POA: Diagnosis not present

## 2022-01-17 DIAGNOSIS — E1159 Type 2 diabetes mellitus with other circulatory complications: Secondary | ICD-10-CM | POA: Diagnosis not present

## 2022-01-17 DIAGNOSIS — Z79899 Other long term (current) drug therapy: Secondary | ICD-10-CM | POA: Diagnosis not present

## 2022-01-17 DIAGNOSIS — I10 Essential (primary) hypertension: Secondary | ICD-10-CM | POA: Diagnosis not present

## 2022-01-17 DIAGNOSIS — D751 Secondary polycythemia: Secondary | ICD-10-CM | POA: Diagnosis not present

## 2022-01-17 DIAGNOSIS — E039 Hypothyroidism, unspecified: Secondary | ICD-10-CM | POA: Diagnosis not present

## 2022-01-24 DIAGNOSIS — E785 Hyperlipidemia, unspecified: Secondary | ICD-10-CM | POA: Diagnosis not present

## 2022-01-24 DIAGNOSIS — D751 Secondary polycythemia: Secondary | ICD-10-CM | POA: Diagnosis not present

## 2022-01-24 DIAGNOSIS — R3129 Other microscopic hematuria: Secondary | ICD-10-CM | POA: Diagnosis not present

## 2022-01-24 DIAGNOSIS — I10 Essential (primary) hypertension: Secondary | ICD-10-CM | POA: Diagnosis not present

## 2022-01-24 DIAGNOSIS — I700 Atherosclerosis of aorta: Secondary | ICD-10-CM | POA: Diagnosis not present

## 2022-01-30 ENCOUNTER — Encounter: Payer: Self-pay | Admitting: Podiatry

## 2022-01-30 ENCOUNTER — Ambulatory Visit: Payer: 59 | Admitting: Podiatry

## 2022-01-30 DIAGNOSIS — L600 Ingrowing nail: Secondary | ICD-10-CM | POA: Diagnosis not present

## 2022-01-30 NOTE — Patient Instructions (Signed)

## 2022-01-30 NOTE — Progress Notes (Signed)
Subjective:   Patient ID: Carol Stewart, female   DOB: 62 y.o.   MRN: [id number]   HPI Patient states she has had chronic ingrown toenail right big toe and points to both borders and also the center portion of the nail which is not as sore but can be bothersome.  Patient states it has been going on for a long time does smoke a pack of cigarettes per day patient is not active   Review of Systems  All other systems reviewed and are negative.       Objective:  Physical Exam Vitals and nursing note reviewed.  Constitutional:      Appearance: She is well-developed.  Pulmonary:     Effort: Pulmonary effort is normal.  Musculoskeletal:        General: Normal range of motion.  Skin:    General: Skin is warm.  Neurological:     Mental Status: She is alert.     Neurovascular status intact muscle strength found to be adequate range of motion adequate with incurvated medial lateral border right big toe sore when pressed moderate thickness of the nailbed itself with most the pain at seeming to occur within the nail corners right ove medial over lateral.  Good digital perfusion well-oriented x 3 Assessment:  Chronic ingrown toenail deformity right hallux and evolving both borders with the medial being worse and possibly also central portion of nail     Plan:  H&P reviewed condition and did discuss treatment options and I recommended removing the borders and it is possible at 1 point future the entire nail may need to be removed I offered her that today she would rather do the borders and see how it does.  I did go over with her what would be done I reviewed with her the procedure and at this point I allowed her to sign consent form.  I anesthetized the right hallux 60 mg like Marcaine mixture sterile prep done using sterile instrumentation remove the medial lateral borders exposed matrix applied phenol 3 applications 30 seconds followed by alcohol lavage sterile dressing gave instructions on soaks  and to wear dressing 24 hours take it off earlier if throbbing were to occur.  Encouraged her to call with questions concerns which may arise

## 2022-02-03 DIAGNOSIS — E785 Hyperlipidemia, unspecified: Secondary | ICD-10-CM | POA: Diagnosis not present

## 2022-02-03 DIAGNOSIS — E1165 Type 2 diabetes mellitus with hyperglycemia: Secondary | ICD-10-CM | POA: Diagnosis not present

## 2022-02-05 ENCOUNTER — Telehealth: Payer: Self-pay | Admitting: *Deleted

## 2022-02-05 NOTE — Telephone Encounter (Addendum)
Patient is calling because her toe is red and inflamed,sore  from the cuticle down to the first joint,may need an antibiotic, please advise.

## 2022-02-07 ENCOUNTER — Other Ambulatory Visit: Payer: Self-pay | Admitting: Podiatry

## 2022-02-07 MED ORDER — DOXYCYCLINE HYCLATE 100 MG PO TABS: 100.0000 mg | ORAL_TABLET | Freq: Two times a day (BID) | ORAL | 0 refills | Status: DC

## 2022-02-07 NOTE — Telephone Encounter (Signed)
Prescription sent to pharmacy.  Recommend f/u with Dr. Paulla Dolly next week.  Please reach out to patient to see if she would like an appointment.  Thanks, Dr. Amalia Hailey

## 2022-02-07 NOTE — Telephone Encounter (Signed)
Patient is calling because is so red and a clear drainage, sore, swelling, not getting any better, has started soaking with  peroxide which helps some, still using soaking as instructed. Requesting antibiotic because signs of infection.Please advise.

## 2022-02-07 NOTE — Progress Notes (Signed)
Rx doxycycline 100 mg 2 times daily #20 as needed localized erythema with redness after partial nail matricectomy procedure.   Edrick Kins, DPM [facility]  Dr. Edrick Kins, DPM    [address], [place] [address]                Office [phone]  Fax [phone]

## 2022-02-10 ENCOUNTER — Ambulatory Visit: Payer: 59 | Admitting: Podiatry

## 2022-02-10 DIAGNOSIS — L03031 Cellulitis of right toe: Secondary | ICD-10-CM

## 2022-02-10 DIAGNOSIS — E785 Hyperlipidemia, unspecified: Secondary | ICD-10-CM | POA: Diagnosis not present

## 2022-02-10 DIAGNOSIS — E039 Hypothyroidism, unspecified: Secondary | ICD-10-CM | POA: Diagnosis not present

## 2022-02-10 NOTE — Telephone Encounter (Signed)
Thanks

## 2022-02-11 NOTE — Progress Notes (Signed)
Subjective:   Patient ID: Carol Stewart, female   DOB: 62 y.o.   MRN: [id number]   HPI Patient presents with some redness in the right big toe he just wanted to get it checked stating that it has been sore and she is on antibiotics and it does seem some better but still wants it checked   ROS      Objective:  Physical Exam  Neurovascular status is intact with redness of the medial border right hallux that is localized no proximal edema erythema drainage from this area with crusted tissue formation     Assessment:  Seems to be doing okay after having ingrown toenail removed with doxycycline with mild redness in the area but very good sign that it is improving     Plan:  H&P continue soaks and bandage usage and continue antibiotics for the next 5 days and this should heal uneventfully given strict instructions of any increased redness pain drainage were to occur to reappoint immediately

## 2022-05-19 ENCOUNTER — Ambulatory Visit: Payer: 59 | Admitting: Podiatry

## 2022-05-19 ENCOUNTER — Encounter: Payer: Self-pay | Admitting: Podiatry

## 2022-05-19 DIAGNOSIS — L600 Ingrowing nail: Secondary | ICD-10-CM | POA: Diagnosis not present

## 2022-05-19 DIAGNOSIS — L03031 Cellulitis of right toe: Secondary | ICD-10-CM

## 2022-05-19 NOTE — Progress Notes (Signed)
Subjective:   Patient ID: Carol Stewart, female   DOB: 62 y.o.   MRN: [id number]   HPI Patient states the right great toenail is still sore and she may need to have the entire nail removed   ROS      Objective:  Physical Exam  Neurovascular status intact with a incurvated right hallux nail that is thickened across the entire bed with the corners having been removed and doing well with no erythema edema drainage noted     Assessment:  Damage right hallux nail with the entire nail being involved in this condition      Plan:  H&P reviewed I debrided the top of the nail debrided out the corners and applied cushioning with soaks.  If this persist we will get a need to remove this nail permanently but hopefully this will take care of the problem

## 2022-05-21 ENCOUNTER — Ambulatory Visit: Payer: 59 | Admitting: Urology

## 2022-05-29 DIAGNOSIS — Z79899 Other long term (current) drug therapy: Secondary | ICD-10-CM | POA: Diagnosis not present

## 2022-05-29 DIAGNOSIS — E785 Hyperlipidemia, unspecified: Secondary | ICD-10-CM | POA: Diagnosis not present

## 2022-05-29 DIAGNOSIS — I10 Essential (primary) hypertension: Secondary | ICD-10-CM | POA: Diagnosis not present

## 2022-06-14 DIAGNOSIS — M722 Plantar fascial fibromatosis: Secondary | ICD-10-CM | POA: Insufficient documentation

## 2022-06-16 DIAGNOSIS — E1169 Type 2 diabetes mellitus with other specified complication: Secondary | ICD-10-CM | POA: Diagnosis not present

## 2022-06-16 DIAGNOSIS — E785 Hyperlipidemia, unspecified: Secondary | ICD-10-CM | POA: Diagnosis not present

## 2022-06-17 DIAGNOSIS — Z01419 Encounter for gynecological examination (general) (routine) without abnormal findings: Secondary | ICD-10-CM | POA: Diagnosis not present

## 2022-06-17 DIAGNOSIS — Z1231 Encounter for screening mammogram for malignant neoplasm of breast: Secondary | ICD-10-CM | POA: Diagnosis not present

## 2022-06-17 DIAGNOSIS — Z124 Encounter for screening for malignant neoplasm of cervix: Secondary | ICD-10-CM | POA: Diagnosis not present

## 2022-08-04 DIAGNOSIS — E1165 Type 2 diabetes mellitus with hyperglycemia: Secondary | ICD-10-CM | POA: Diagnosis not present

## 2022-08-04 DIAGNOSIS — E039 Hypothyroidism, unspecified: Secondary | ICD-10-CM | POA: Diagnosis not present

## 2022-08-13 ENCOUNTER — Other Ambulatory Visit: Payer: Self-pay | Admitting: Podiatry

## 2022-08-13 ENCOUNTER — Ambulatory Visit (INDEPENDENT_AMBULATORY_CARE_PROVIDER_SITE_OTHER): Payer: 59

## 2022-08-13 ENCOUNTER — Ambulatory Visit: Payer: 59 | Admitting: Podiatry

## 2022-08-13 ENCOUNTER — Encounter: Payer: Self-pay | Admitting: Podiatry

## 2022-08-13 DIAGNOSIS — M79672 Pain in left foot: Secondary | ICD-10-CM

## 2022-08-13 DIAGNOSIS — M722 Plantar fascial fibromatosis: Secondary | ICD-10-CM

## 2022-08-13 MED ORDER — DICLOFENAC SODIUM 75 MG PO TBEC: 75.0000 mg | DELAYED_RELEASE_TABLET | Freq: Two times a day (BID) | ORAL | 2 refills | Status: DC

## 2022-08-13 NOTE — Progress Notes (Signed)
Subjective:   Patient ID: Carol Stewart, female   DOB: 62 y.o.   MRN: [id number]   HPI Patient states has developed a lot of pain in her left heel and states that it has been going on for several months does not remember injury   ROS      Objective:  Physical Exam  Neurovascular status intact exquisite discomfort medial fascial band left insertional point tendon calcaneus with moderate depression of the arch     Assessment:  Acute plantar fasciitis left with inflammation fluid around the medial central band     Plan:  H&P x-ray taken sterile prep injected the plantar fascia left 3 mg Kenalog 5 mg Xylocaine and went ahead today applied fascial bracing to lift up the arch and take stress off the insertional point of the fascia.  Reappoint 2 weeks may require orthotics

## 2022-08-15 DIAGNOSIS — E785 Hyperlipidemia, unspecified: Secondary | ICD-10-CM | POA: Diagnosis not present

## 2022-08-15 DIAGNOSIS — E039 Hypothyroidism, unspecified: Secondary | ICD-10-CM | POA: Diagnosis not present

## 2022-08-15 DIAGNOSIS — E1165 Type 2 diabetes mellitus with hyperglycemia: Secondary | ICD-10-CM | POA: Diagnosis not present

## 2022-08-15 DIAGNOSIS — I10 Essential (primary) hypertension: Secondary | ICD-10-CM | POA: Diagnosis not present

## 2022-08-28 ENCOUNTER — Encounter: Payer: Self-pay | Admitting: Podiatry

## 2022-08-28 ENCOUNTER — Ambulatory Visit: Payer: 59 | Admitting: Podiatry

## 2022-08-28 DIAGNOSIS — B009 Herpesviral infection, unspecified: Secondary | ICD-10-CM | POA: Insufficient documentation

## 2022-08-28 DIAGNOSIS — F419 Anxiety disorder, unspecified: Secondary | ICD-10-CM | POA: Insufficient documentation

## 2022-08-28 DIAGNOSIS — F17200 Nicotine dependence, unspecified, uncomplicated: Secondary | ICD-10-CM | POA: Insufficient documentation

## 2022-08-28 DIAGNOSIS — E039 Hypothyroidism, unspecified: Secondary | ICD-10-CM | POA: Insufficient documentation

## 2022-08-28 DIAGNOSIS — M722 Plantar fascial fibromatosis: Secondary | ICD-10-CM

## 2022-08-28 DIAGNOSIS — R519 Headache, unspecified: Secondary | ICD-10-CM | POA: Insufficient documentation

## 2022-08-28 DIAGNOSIS — N870 Mild cervical dysplasia: Secondary | ICD-10-CM | POA: Insufficient documentation

## 2022-08-28 DIAGNOSIS — E785 Hyperlipidemia, unspecified: Secondary | ICD-10-CM | POA: Insufficient documentation

## 2022-08-28 DIAGNOSIS — E0500 Thyrotoxicosis with diffuse goiter without thyrotoxic crisis or storm: Secondary | ICD-10-CM | POA: Insufficient documentation

## 2022-08-28 DIAGNOSIS — Z8639 Personal history of other endocrine, nutritional and metabolic disease: Secondary | ICD-10-CM | POA: Insufficient documentation

## 2022-08-28 DIAGNOSIS — E1165 Type 2 diabetes mellitus with hyperglycemia: Secondary | ICD-10-CM | POA: Insufficient documentation

## 2022-08-28 MED ORDER — TRIAMCINOLONE ACETONIDE 10 MG/ML IJ SUSP
10.0000 mg | Freq: Once | INTRAMUSCULAR | Status: AC
  Administered 2022-08-28: 10 mg via INTRA_ARTICULAR

## 2022-08-28 NOTE — Progress Notes (Signed)
Subjective:   Patient ID: Carol Stewart, female   DOB: 62 y.o.   MRN: [id number]   HPI Patient presents with a lot of pain in the outside left heel plantar but states that it is improved from last visit and taping is helping   ROS      Objective:  Physical Exam  Neurovascular status intact patient found to have continued discomfort plantar lateral aspect the left heel with inflammation fluid buildup     Assessment:  Plantar fasciitis left still present improved from previous visit but still present     Plan:  H&P done reviewed that this is a more difficult type of fascial inflammation being lateral and I did go from the lateral side and I carefully injected 3 mg dexamethasone Kenalog 5 mg Xylocaine.  Continue taping as discussed the possibility for immobilization if symptoms recur

## 2022-10-06 DIAGNOSIS — I10 Essential (primary) hypertension: Secondary | ICD-10-CM | POA: Diagnosis not present

## 2022-10-06 DIAGNOSIS — Z79899 Other long term (current) drug therapy: Secondary | ICD-10-CM | POA: Diagnosis not present

## 2022-10-06 DIAGNOSIS — D751 Secondary polycythemia: Secondary | ICD-10-CM | POA: Diagnosis not present

## 2022-10-06 DIAGNOSIS — I700 Atherosclerosis of aorta: Secondary | ICD-10-CM | POA: Diagnosis not present

## 2022-10-06 DIAGNOSIS — E118 Type 2 diabetes mellitus with unspecified complications: Secondary | ICD-10-CM | POA: Diagnosis not present

## 2022-10-06 DIAGNOSIS — E785 Hyperlipidemia, unspecified: Secondary | ICD-10-CM | POA: Diagnosis not present

## 2022-10-07 DIAGNOSIS — E0500 Thyrotoxicosis with diffuse goiter without thyrotoxic crisis or storm: Secondary | ICD-10-CM | POA: Diagnosis not present

## 2022-10-07 DIAGNOSIS — H0261 Xanthelasma of right upper eyelid: Secondary | ICD-10-CM | POA: Diagnosis not present

## 2022-10-07 DIAGNOSIS — H04123 Dry eye syndrome of bilateral lacrimal glands: Secondary | ICD-10-CM | POA: Diagnosis not present

## 2022-10-07 DIAGNOSIS — H0264 Xanthelasma of left upper eyelid: Secondary | ICD-10-CM | POA: Diagnosis not present

## 2022-10-07 DIAGNOSIS — H0520 Unspecified exophthalmos: Secondary | ICD-10-CM | POA: Diagnosis not present

## 2022-10-19 ENCOUNTER — Other Ambulatory Visit: Payer: Self-pay | Admitting: Podiatry

## 2022-11-12 ENCOUNTER — Other Ambulatory Visit: Payer: Self-pay | Admitting: Podiatry

## 2022-12-15 DIAGNOSIS — E1165 Type 2 diabetes mellitus with hyperglycemia: Secondary | ICD-10-CM | POA: Diagnosis not present

## 2022-12-15 DIAGNOSIS — E785 Hyperlipidemia, unspecified: Secondary | ICD-10-CM | POA: Diagnosis not present

## 2022-12-22 DIAGNOSIS — E785 Hyperlipidemia, unspecified: Secondary | ICD-10-CM | POA: Diagnosis not present

## 2022-12-22 DIAGNOSIS — I10 Essential (primary) hypertension: Secondary | ICD-10-CM | POA: Diagnosis not present

## 2022-12-22 DIAGNOSIS — Z8639 Personal history of other endocrine, nutritional and metabolic disease: Secondary | ICD-10-CM | POA: Diagnosis not present

## 2022-12-22 DIAGNOSIS — E1165 Type 2 diabetes mellitus with hyperglycemia: Secondary | ICD-10-CM | POA: Diagnosis not present

## 2022-12-22 DIAGNOSIS — E039 Hypothyroidism, unspecified: Secondary | ICD-10-CM | POA: Diagnosis not present

## 2022-12-22 DIAGNOSIS — F17200 Nicotine dependence, unspecified, uncomplicated: Secondary | ICD-10-CM | POA: Diagnosis not present

## 2022-12-29 IMAGING — US US ABDOMEN LIMITED
1 series · 14 of 25 positions shown · non-contrast
Comparison: Noncontrast CT 01/16/2011

CLINICAL DATA: Right upper quadrant pain.

EXAM:
ULTRASOUND ABDOMEN LIMITED RIGHT UPPER QUADRANT

[Series 1: us abdomen limited ruq (liver/gb) · 14 of 67 slices shown]
[im 1/67]
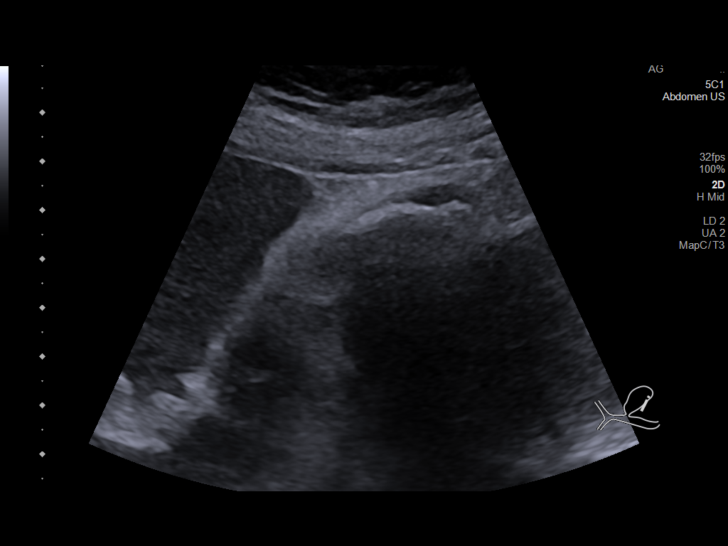
[im 6/67]
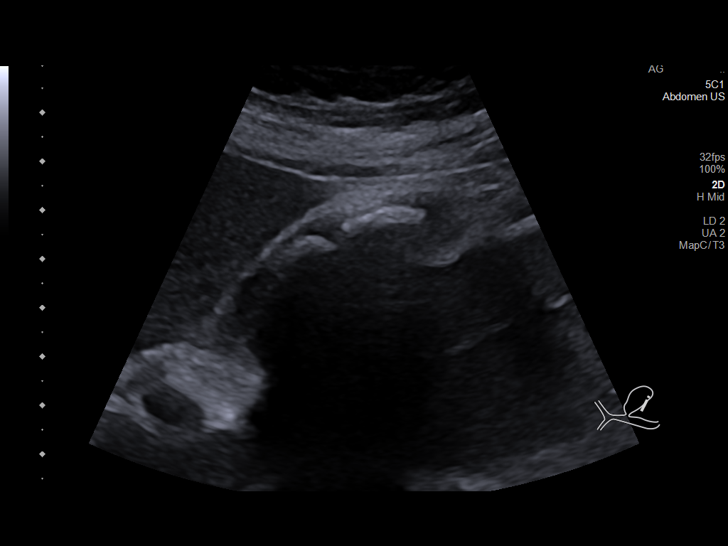
[im 12/67]
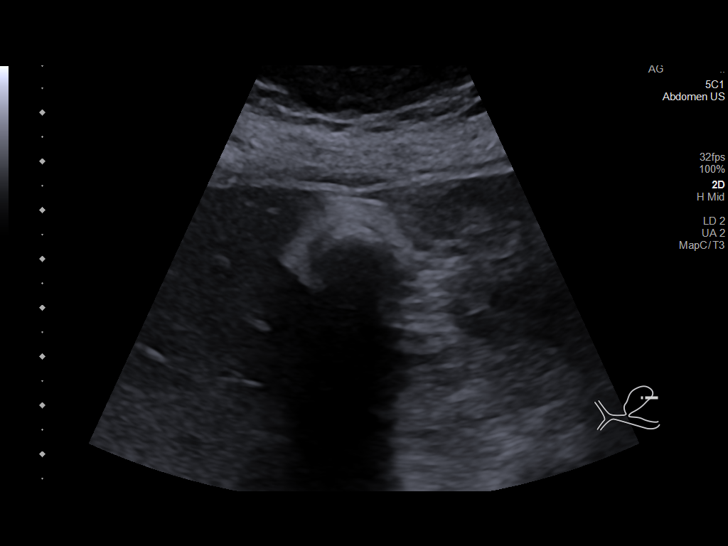
[im 17/67]
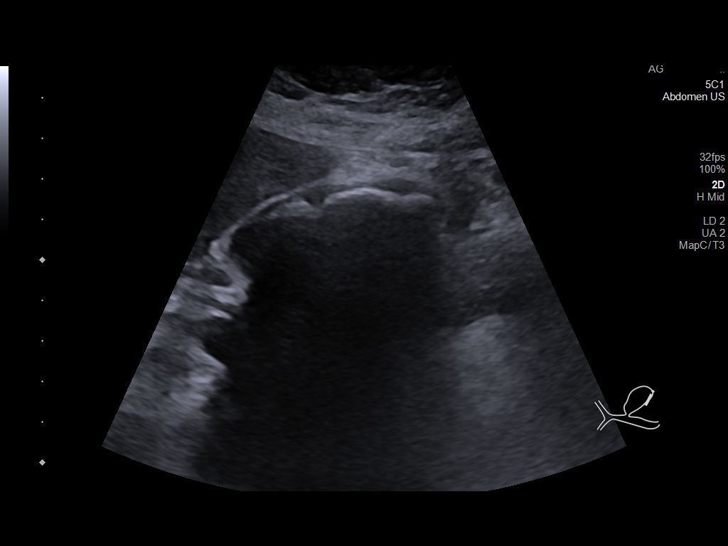
[im 23/67]
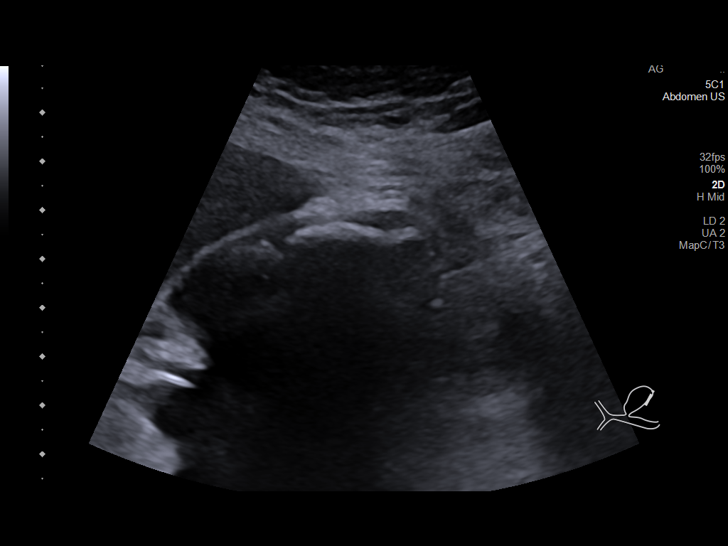
[im 25/67]
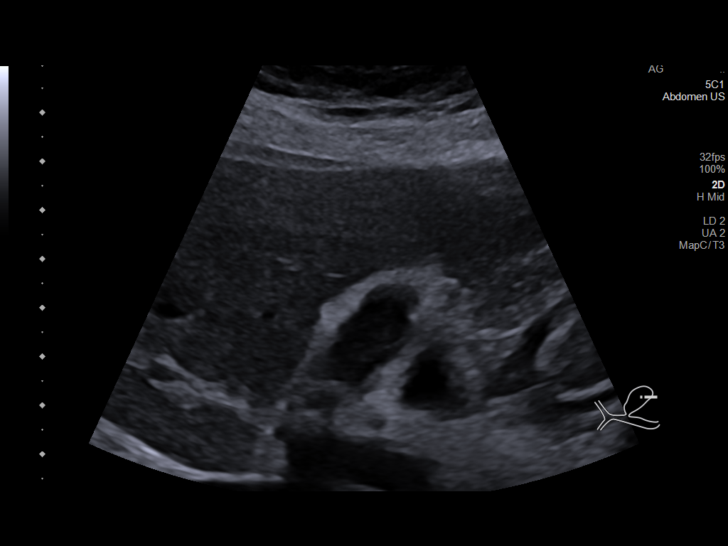
[im 31/67]
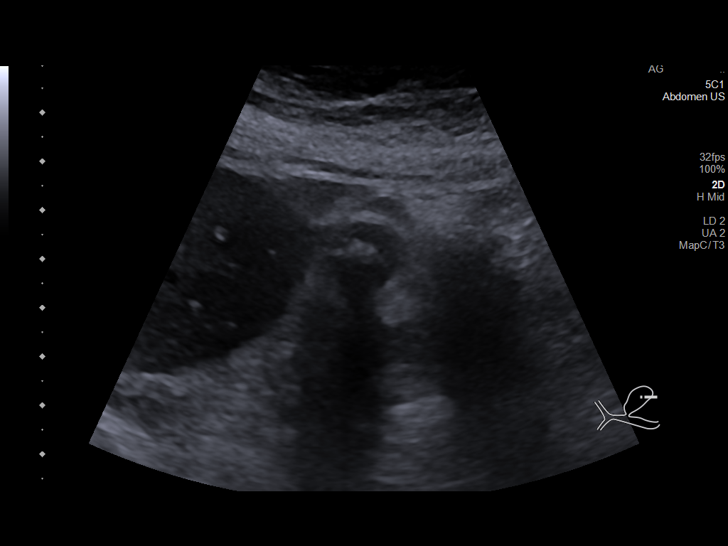
[im 36/67]
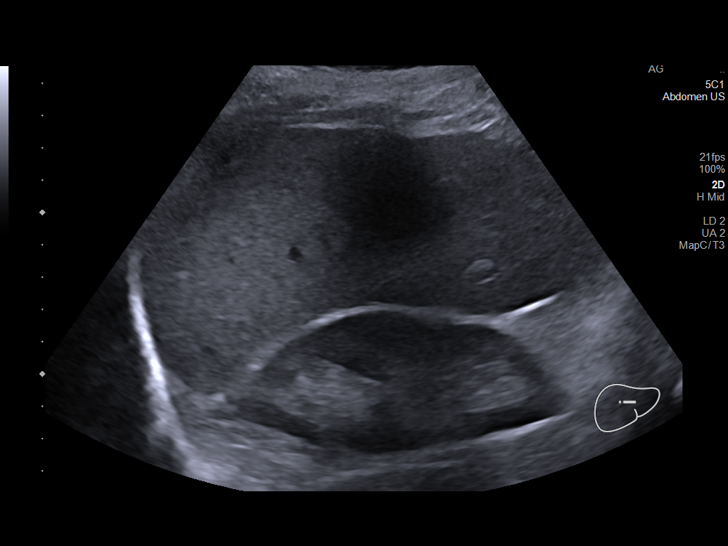
[im 42/67]
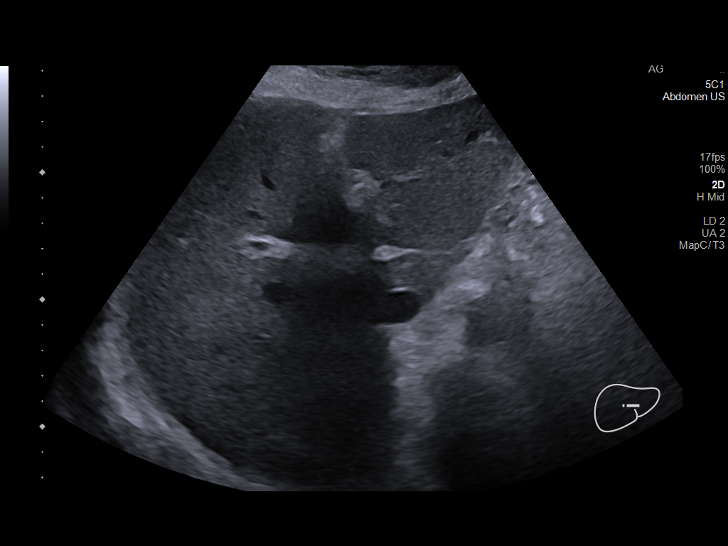
[im 45/67]
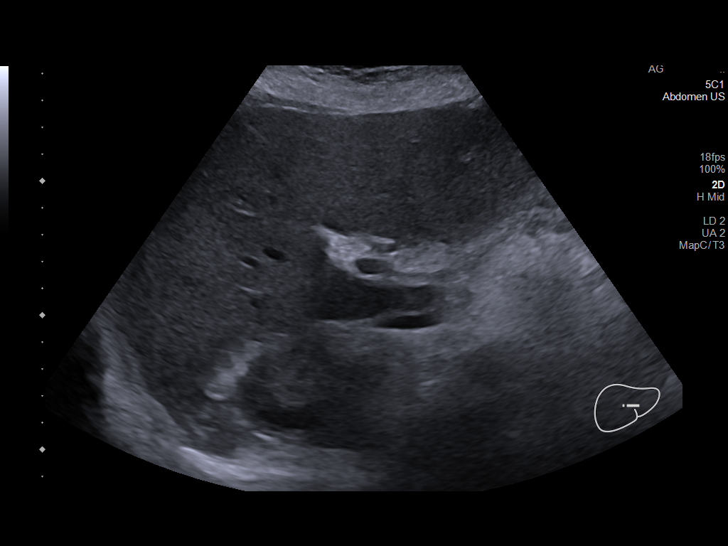
[im 50/67]
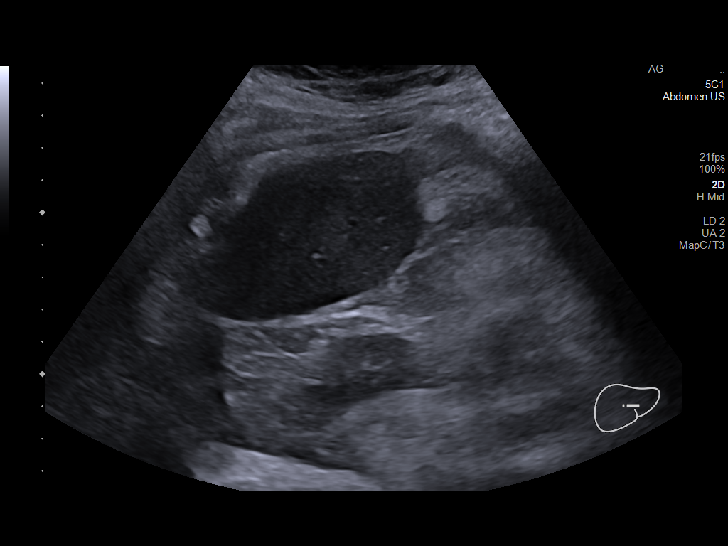
[im 56/67]
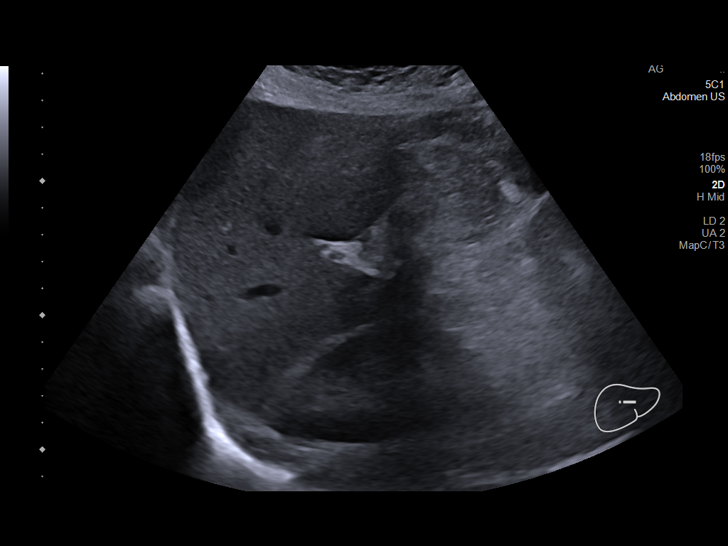
[im 61/67]
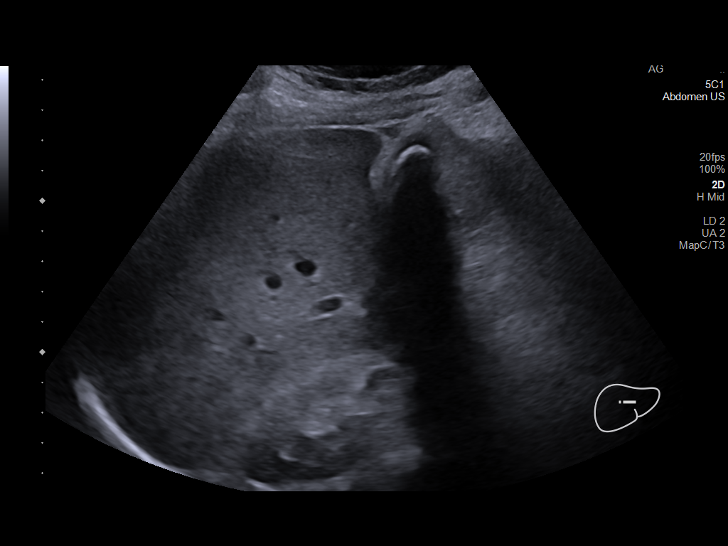
[im 67/67]
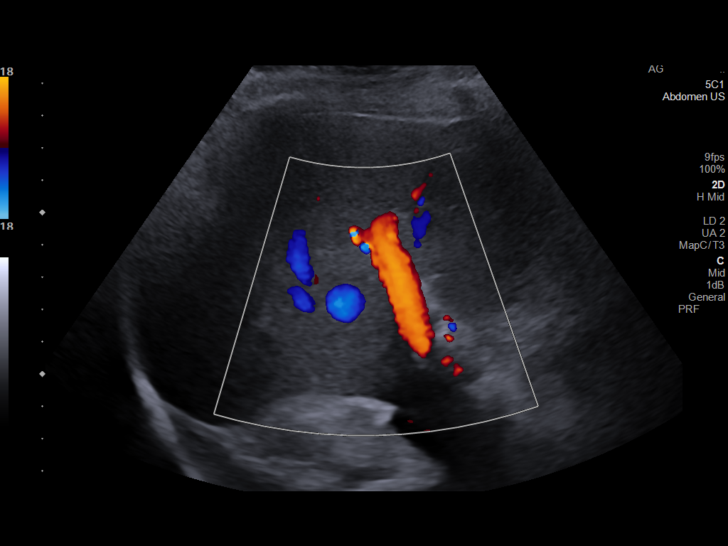

[14 of 25 positions shown; findings below may reference images not displayed]

FINDINGS: Gallbladder:

The gallbladder is filled with multiple shadowing stones creating a
wall echo shadow complex and obscuring the posterior gallbladder
wall. Largest stone measures 2.2 cm. There is no gallbladder wall
thickening or pericholecystic fluid. No sonographic Murphy sign
noted by sonographer.

Common bile duct:

Diameter: 2 mm, normal.

Liver:

Mildly increased hepatic parenchymal echogenicity. No discrete focal
lesion. No capsular nodularity. portal vein is patent on color
Doppler imaging with normal direction of blood flow towards the
liver.

Other: No right upper quadrant ascites.
IMPRESSION: 1. Multiple gallstones without sonographic findings of acute
cholecystitis.
2. Mild hepatic steatosis.

## 2023-03-03 DIAGNOSIS — D751 Secondary polycythemia: Secondary | ICD-10-CM | POA: Diagnosis not present

## 2023-03-03 DIAGNOSIS — I10 Essential (primary) hypertension: Secondary | ICD-10-CM | POA: Diagnosis not present

## 2023-04-22 DIAGNOSIS — E039 Hypothyroidism, unspecified: Secondary | ICD-10-CM | POA: Diagnosis not present

## 2023-04-22 DIAGNOSIS — E1165 Type 2 diabetes mellitus with hyperglycemia: Secondary | ICD-10-CM | POA: Diagnosis not present

## 2023-04-29 DIAGNOSIS — E785 Hyperlipidemia, unspecified: Secondary | ICD-10-CM | POA: Diagnosis not present

## 2023-04-29 DIAGNOSIS — E039 Hypothyroidism, unspecified: Secondary | ICD-10-CM | POA: Diagnosis not present

## 2023-04-29 DIAGNOSIS — E1165 Type 2 diabetes mellitus with hyperglycemia: Secondary | ICD-10-CM | POA: Diagnosis not present

## 2023-04-29 DIAGNOSIS — I10 Essential (primary) hypertension: Secondary | ICD-10-CM | POA: Diagnosis not present

## 2023-06-10 DIAGNOSIS — E785 Hyperlipidemia, unspecified: Secondary | ICD-10-CM | POA: Diagnosis not present

## 2023-06-10 DIAGNOSIS — E1165 Type 2 diabetes mellitus with hyperglycemia: Secondary | ICD-10-CM | POA: Diagnosis not present
